# Patient Record
Sex: Female | Born: 1935 | Race: White | Hispanic: No | Smoking: Never smoker
Health system: Southern US, Community
[De-identification: ages and names within clinical notes are randomized; demographics above are authoritative.]

## PROBLEM LIST (undated history)

## (undated) DIAGNOSIS — G8929 Other chronic pain: Secondary | ICD-10-CM

## (undated) DIAGNOSIS — K219 Gastro-esophageal reflux disease without esophagitis: Secondary | ICD-10-CM

## (undated) DIAGNOSIS — G2581 Restless legs syndrome: Secondary | ICD-10-CM

## (undated) DIAGNOSIS — I10 Essential (primary) hypertension: Secondary | ICD-10-CM

## (undated) DIAGNOSIS — J302 Other seasonal allergic rhinitis: Secondary | ICD-10-CM

## (undated) DIAGNOSIS — M549 Dorsalgia, unspecified: Secondary | ICD-10-CM

## (undated) HISTORY — PX: CATARACT EXTRACTION: SUR2

## (undated) HISTORY — PX: HIP SURGERY: SHX245

## (undated) HISTORY — PX: BACK SURGERY: SHX140

---

## 2014-10-30 ENCOUNTER — Emergency Department (INDEPENDENT_AMBULATORY_CARE_PROVIDER_SITE_OTHER)
Admission: EM | Admit: 2014-10-30 | Discharge: 2014-10-30 | Disposition: A | Discharge status: Home / Self Care | Payer: Medicare Other | Source: Home / Self Care | Attending: Emergency Medicine | Admitting: Emergency Medicine

## 2014-10-30 ENCOUNTER — Emergency Department (INDEPENDENT_AMBULATORY_CARE_PROVIDER_SITE_OTHER): Payer: Medicare Other

## 2014-10-30 ENCOUNTER — Encounter: Payer: Self-pay | Admitting: *Deleted

## 2014-10-30 DIAGNOSIS — L539 Erythematous condition, unspecified: Secondary | ICD-10-CM

## 2014-10-30 DIAGNOSIS — M25421 Effusion, right elbow: Secondary | ICD-10-CM

## 2014-10-30 DIAGNOSIS — L03113 Cellulitis of right upper limb: Secondary | ICD-10-CM | POA: Diagnosis not present

## 2014-10-30 HISTORY — DX: Restless legs syndrome: G25.81

## 2014-10-30 HISTORY — DX: Dorsalgia, unspecified: M54.9

## 2014-10-30 HISTORY — DX: Gastro-esophageal reflux disease without esophagitis: K21.9

## 2014-10-30 HISTORY — DX: Other chronic pain: G89.29

## 2014-10-30 HISTORY — DX: Essential (primary) hypertension: I10

## 2014-10-30 HISTORY — DX: Other seasonal allergic rhinitis: J30.2

## 2014-10-30 MED ORDER — SULFAMETHOXAZOLE-TRIMETHOPRIM 800-160 MG PO TABS: 1.0000 | ORAL_TABLET | Freq: Two times a day (BID) | ORAL | Status: AC

## 2014-10-30 MED ORDER — CEFTRIAXONE SODIUM 1 G IJ SOLR
1.0000 g | Freq: Once | INTRAMUSCULAR | Status: AC
  Administered 2014-10-30: 1 g via INTRAMUSCULAR

## 2014-10-30 MED ORDER — CEPHALEXIN 500 MG PO CAPS: 500.0000 mg | ORAL_CAPSULE | Freq: Four times a day (QID) | ORAL | Status: DC

## 2014-10-30 NOTE — ED Notes (Signed)
Pt c/o RT elbow redness and swelling x 2 days. Denies fever. She reports a skin tear on that elbow x 1wk.

## 2014-10-30 NOTE — ED Provider Notes (Signed)
CSN: [id number]     Arrival date & time 10/30/14  1704 History   First MD Initiated Contact with Patient 10/30/14 1741     Chief Complaint  Patient presents with  . Joint Swelling   (Consider location/radiation/quality/duration/timing/severity/associated sxs/prior Treatment) Patient is a 80 y.o. female presenting with arm injury. The history is provided by the patient. No language interpreter was used.  Arm Injury Location:  Arm and elbow Time since incident:  2 days Injury: no   Arm location:  R arm Elbow location:  R elbow Pain details:    Quality:  Aching   Radiates to:  Does not radiate   Severity:  Moderate   Onset quality:  Gradual   Duration:  2 days   Timing:  Constant   Progression:  Worsening Chronicity:  New Handedness:  Right-handed Dislocation: no   Prior injury to area:  Yes Relieved by:  Acetaminophen Worsened by:  Nothing tried Ineffective treatments:  None tried Pt reports she hit her arm a week ago.  Now elbow is red and swollen.   [place]  Past Medical History  Diagnosis Date  . Hypertension   . Chronic back pain   . RLS (restless legs syndrome)   . GERD (gastroesophageal reflux disease)   . Seasonal allergies    Past Surgical History  Procedure Laterality Date  . Hip surgery    . Back surgery    . Cataract extraction     History reviewed. No pertinent family history. History  Substance Use Topics  . Smoking status: Never Smoker   . Smokeless tobacco: Not on file  . Alcohol Use: No   OB History    No data available     Review of Systems  Musculoskeletal: Positive for joint swelling.  Skin: Positive for color change.  All other systems reviewed and are negative.   Allergies  Motrin  Home Medications   Prior to Admission medications   Medication Sig Start Date End Date Taking? Authorizing Provider  aspirin 81 MG chewable tablet Chew by mouth daily.   Yes Historical Provider, MD  Biotin 1 MG CAPS Take by  mouth.   Yes Historical Provider, MD  Calcium-Phosphorus-Vitamin D (CITRACAL +D3 PO) Take by mouth.   Yes Historical Provider, MD  Carboxymethylcellul-Glycerin (REFRESH OPTIVE OP) Apply to eye.   Yes Historical Provider, MD  etodolac (LODINE) 400 MG tablet Take 400 mg by mouth 2 (two) times daily.   Yes Historical Provider, MD  ferrous fumarate (HEMOCYTE - 106 MG FE) 325 (106 FE) MG TABS tablet Take 1 tablet by mouth.   Yes Historical Provider, MD  fexofenadine (ALLEGRA) 180 MG tablet Take 180 mg by mouth daily.   Yes Historical Provider, MD  fluticasone (FLONASE) 50 MCG/ACT nasal spray Place into both nostrils daily.   Yes Historical Provider, MD  Fluticasone-Salmeterol (ADVAIR) 100-50 MCG/DOSE AEPB Inhale 1 puff into the lungs 2 (two) times daily.   Yes Historical Provider, MD  hydrochlorothiazide (HYDRODIURIL) 12.5 MG tablet Take 12.5 mg by mouth daily.   Yes Historical Provider, MD  levETIRAcetam (KEPPRA) 250 MG tablet Take 250 mg by mouth 2 (two) times daily.   Yes Historical Provider, MD  magnesium 30 MG tablet Take 500 mg by mouth 2 (two) times daily.   Yes Historical Provider, MD  montelukast (SINGULAIR) 10 MG tablet Take 10 mg by mouth at bedtime.   Yes Historical Provider, MD  oxybutynin (DITROPAN) 5 MG tablet Take 5 mg  by mouth 3 (three) times daily.   Yes Historical Provider, MD  pramipexole (MIRAPEX) 0.25 MG tablet Take 0.25 mg by mouth 3 (three) times daily.   Yes Historical Provider, MD  pregabalin (LYRICA) 150 MG capsule Take 150 mg by mouth 2 (two) times daily.   Yes Historical Provider, MD  primidone (MYSOLINE) 50 MG tablet Take by mouth 4 (four) times daily.   Yes Historical Provider, MD  propranolol (INDERAL) 60 MG tablet Take 60 mg by mouth 3 (three) times daily.   Yes Historical Provider, MD  RABEprazole (ACIPHEX) 20 MG tablet Take 20 mg by mouth daily.   Yes Historical Provider, MD  simvastatin (ZOCOR) 20 MG tablet Take 20 mg by mouth daily.   Yes Historical Provider, MD   vitamin E 100 UNIT capsule Take by mouth daily.   Yes Historical Provider, MD  Zinc 50 MG CAPS Take by mouth.   Yes Historical Provider, MD   BP 122/63 mmHg  Temp(Src) 98.4 F (36.9 C) (Oral)  Resp 16  Ht 4\' 8"  (1.422 m)  Wt 121 lb (54.885 kg)  BMI 27.14 kg/m2 Physical Exam  Constitutional: She appears well-developed.  HENT:  Head: Normocephalic.  Musculoskeletal: She exhibits tenderness.  Redness to mid upper arm, around right elbow,  Warm to touch   Neurological: She is alert.  Skin: There is erythema.  Psychiatric: She has a normal mood and affect.  Nursing note and vitals reviewed.   ED Course  Procedures (including critical care time) Labs Review Labs Reviewed - No data to display  Imaging Review No results found.   MDM   1. Cellulitis of right upper extremity   2. Redness    Pt given rocephin IM Rx for keflex and bactrim Pt advised to recheck here tomorrow. (pt does not want to go to hospital)     Elson AreasLeslie K Sofia, PA-C 10/30/14 1824

## 2014-10-30 NOTE — Discharge Instructions (Signed)

## 2014-10-31 ENCOUNTER — Emergency Department (INDEPENDENT_AMBULATORY_CARE_PROVIDER_SITE_OTHER)
Admission: EM | Admit: 2014-10-31 | Discharge: 2014-10-31 | Disposition: A | Discharge status: Home / Self Care | Payer: Medicare Other | Source: Home / Self Care | Attending: Family Medicine | Admitting: Family Medicine

## 2014-10-31 ENCOUNTER — Encounter: Payer: Self-pay | Admitting: *Deleted

## 2014-10-31 DIAGNOSIS — D649 Anemia, unspecified: Secondary | ICD-10-CM | POA: Diagnosis not present

## 2014-10-31 DIAGNOSIS — L03113 Cellulitis of right upper limb: Secondary | ICD-10-CM | POA: Diagnosis not present

## 2014-10-31 LAB — POCT CBC W AUTO DIFF (K'VILLE URGENT CARE)

## 2014-10-31 MED ORDER — CEFTRIAXONE SODIUM 1 G IJ SOLR
1.0000 g | Freq: Once | INTRAMUSCULAR | Status: AC
Start: 2014-10-31 — End: 2014-10-31
  Administered 2014-10-31: 1 g via INTRAMUSCULAR

## 2014-10-31 NOTE — ED Provider Notes (Signed)
CSN: [id number]     Arrival date & time 10/31/14  1612 History   First MD Initiated Contact with Patient 10/31/14 1621     Chief Complaint  Patient presents with  . Follow-up      HPI Comments: Patient returns for follow-up of cellulitis of her right arm.  She was evaluated here yesterday, and advised to proceed to local ER for evaluation but refused.  She was administered Rocephin 1gm IM, with oral Keflex and Bactrim, and advised to return today.   Today she reports that she has had a mild decrease in pain.  She denies fevers, chills, and sweats.  The history is provided by the patient and a relative.    Past Medical History  Diagnosis Date  . Hypertension   . Chronic back pain   . RLS (restless legs syndrome)   . GERD (gastroesophageal reflux disease)   . Seasonal allergies    Past Surgical History  Procedure Laterality Date  . Hip surgery    . Back surgery    . Cataract extraction     History reviewed. No pertinent family history. History  Substance Use Topics  . Smoking status: Never Smoker   . Smokeless tobacco: Not on file  . Alcohol Use: No   OB History    No data available     Review of Systems  Constitutional: Negative for fever, chills, activity change and fatigue.  HENT: Negative.   Eyes: Negative.   Respiratory: Negative.   Cardiovascular: Negative.   Gastrointestinal: Negative.   Genitourinary: Negative.   Musculoskeletal: Positive for joint swelling.  Skin: Positive for color change.  Neurological: Negative for dizziness.    Allergies  Motrin  Home Medications   Prior to Admission medications   Medication Sig Start Date End Date Taking? Authorizing Provider  aspirin 81 MG chewable tablet Chew by mouth daily.    Historical Provider, MD  Biotin 1 MG CAPS Take by mouth.    Historical Provider, MD  Calcium-Phosphorus-Vitamin D (CITRACAL +D3 PO) Take by mouth.    Historical Provider, MD  Carboxymethylcellul-Glycerin (REFRESH OPTIVE OP) Apply to  eye.    Historical Provider, MD  cephALEXin (KEFLEX) 500 MG capsule Take 1 capsule (500 mg total) by mouth 4 (four) times daily. 10/30/14   Elson AreasLeslie K Sofia, PA-C  etodolac (LODINE) 400 MG tablet Take 400 mg by mouth 2 (two) times daily.    Historical Provider, MD  ferrous fumarate (HEMOCYTE - 106 MG FE) 325 (106 FE) MG TABS tablet Take 1 tablet by mouth.    Historical Provider, MD  fexofenadine (ALLEGRA) 180 MG tablet Take 180 mg by mouth daily.    Historical Provider, MD  fluticasone (FLONASE) 50 MCG/ACT nasal spray Place into both nostrils daily.    Historical Provider, MD  Fluticasone-Salmeterol (ADVAIR) 100-50 MCG/DOSE AEPB Inhale 1 puff into the lungs 2 (two) times daily.    Historical Provider, MD  hydrochlorothiazide (HYDRODIURIL) 12.5 MG tablet Take 12.5 mg by mouth daily.    Historical Provider, MD  levETIRAcetam (KEPPRA) 250 MG tablet Take 250 mg by mouth 2 (two) times daily.    Historical Provider, MD  magnesium 30 MG tablet Take 500 mg by mouth 2 (two) times daily.    Historical Provider, MD  montelukast (SINGULAIR) 10 MG tablet Take 10 mg by mouth at bedtime.    Historical Provider, MD  oxybutynin (DITROPAN) 5 MG tablet Take 5 mg by mouth 3 (three) times daily.    Historical Provider, MD  pramipexole (MIRAPEX) 0.25 MG tablet Take 0.25 mg by mouth 3 (three) times daily.    Historical Provider, MD  pregabalin (LYRICA) 150 MG capsule Take 150 mg by mouth 2 (two) times daily.    Historical Provider, MD  primidone (MYSOLINE) 50 MG tablet Take by mouth 4 (four) times daily.    Historical Provider, MD  propranolol (INDERAL) 60 MG tablet Take 60 mg by mouth 3 (three) times daily.    Historical Provider, MD  RABEprazole (ACIPHEX) 20 MG tablet Take 20 mg by mouth daily.    Historical Provider, MD  simvastatin (ZOCOR) 20 MG tablet Take 20 mg by mouth daily.    Historical Provider, MD  sulfamethoxazole-trimethoprim (BACTRIM DS,SEPTRA DS) 800-160 MG per tablet Take 1 tablet by mouth 2 (two) times  daily. 10/30/14 11/06/14  Elson AreasLeslie K Sofia, PA-C  vitamin E 100 UNIT capsule Take by mouth daily.    Historical Provider, MD  Zinc 50 MG CAPS Take by mouth.    Historical Provider, MD   BP 92/55 mmHg  Temp(Src) 97.7 F (36.5 C) (Oral)  Resp 14 Physical Exam  Constitutional: She is oriented to person, place, and time. No distress.  HENT:  Head: Normocephalic.  Eyes: Pupils are equal, round, and reactive to light.  Neck: Neck supple.  Musculoskeletal:       Right elbow: She exhibits swelling and effusion. She exhibits normal range of motion, no deformity and no laceration. Tenderness found.       Arms: Right elbow is diffusely erythematous and warm.  Erythema extends proximally to the right upper arm. The olecranon bursa is fluid filled but not tense.  There is bony tenderness to palpation over the elbow epicondyles.  Erythema and mild edema extends to the hand, but no tenderness over hand and distal forearm.  Distal neurovascular function is intact.   Neurological: She is alert and oriented to person, place, and time.  Skin: Skin is warm and dry.  Nursing note and vitals reviewed.   ED Course  Procedures  none    Labs Reviewed  POCT CBC W AUTO DIFF ([facility]) - Abnormal; Notable for the following:  WBC 27.7; LY 9.1; MO 1.6; GR 89.3; Hgb 9.6; Platelets 322     Imaging Review Dg Elbow Complete Right  10/30/2014   CLINICAL DATA:  Right elbow pain for 3 days. Redness and minimal swelling.  EXAM: RIGHT ELBOW - COMPLETE 3+ VIEW  COMPARISON:  None  FINDINGS: There is mild soft tissue swelling surrounding the elbow. Elevation of the anterior fat pad may indicate a small joint effusion. The posterior fat pad appears normal. No displaced fractures noted.  IMPRESSION: 1. Soft tissue swelling. 2. Suspect small effusion. 3. No fractures identified.   Electronically Signed   By: Signa Kellaylor  Stroud M.D.   On: 10/30/2014 18:33     MDM   1. Cellulitis of arm, right; suspect septic arthritis.  Note leukocytosis 27.7 Anemia   Patient continues to refuse hospital admission.  Son agrees that he will take his mother back to [place] within the next 18 hours for hospital admission at home. Will repeat Rocephin 1gm IM Continue present antibiotics.  Elevate arm.  Apply heating pad several times daily.  If symptoms become significantly worse during the night or over the weekend, proceed to the local emergency room.     Lattie HawStephen A Jaiyanna Safran, MD 10/31/14 2003

## 2014-10-31 NOTE — ED Notes (Signed)
Jamie SquibbJane is here today for a recheck of her RT arm cellulitis.

## 2014-10-31 NOTE — Discharge Instructions (Signed)
Continue present antibiotics.  Elevate arm.  Apply heating pad several times daily.  If symptoms become significantly worse during the night or over the weekend, proceed to the local emergency room.

## 2015-04-27 ENCOUNTER — Encounter: Payer: Self-pay | Admitting: Emergency Medicine

## 2015-04-27 ENCOUNTER — Emergency Department (INDEPENDENT_AMBULATORY_CARE_PROVIDER_SITE_OTHER)
Admission: EM | Admit: 2015-04-27 | Discharge: 2015-04-27 | Disposition: A | Discharge status: Home / Self Care | Payer: Medicare Other | Source: Home / Self Care | Attending: Family Medicine | Admitting: Family Medicine

## 2015-04-27 DIAGNOSIS — S0096XA Insect bite (nonvenomous) of unspecified part of head, initial encounter: Secondary | ICD-10-CM

## 2015-04-27 DIAGNOSIS — W57XXXA Bitten or stung by nonvenomous insect and other nonvenomous arthropods, initial encounter: Secondary | ICD-10-CM

## 2015-04-27 MED ORDER — DOXYCYCLINE HYCLATE 100 MG PO CAPS: ORAL_CAPSULE | ORAL | Status: DC

## 2015-04-27 NOTE — ED Notes (Signed)
Pt states she removed a large tick from the back of her head today. It is red and swollen.

## 2015-04-27 NOTE — ED Provider Notes (Signed)
CSN: [id number]     Arrival date & time 04/27/15  1708 History   First MD Initiated Contact with Patient 04/27/15 1747     Chief Complaint  Patient presents with  . Insect Bite      HPI Comments: Patient discovered a large embedded tick on her scalp today.  She is not sure how long the tick had been present but believes that it may have been about 12 hours.  She feels well otherwise.  No rash.  No myalgias or arthralgias.  No fevers, chills, and sweats.  The history is provided by the patient and the spouse.    Past Medical History  Diagnosis Date  . Hypertension   . Chronic back pain   . RLS (restless legs syndrome)   . GERD (gastroesophageal reflux disease)   . Seasonal allergies    Past Surgical History  Procedure Laterality Date  . Hip surgery    . Back surgery    . Cataract extraction     History reviewed. No pertinent family history. History  Substance Use Topics  . Smoking status: Never Smoker   . Smokeless tobacco: Not on file  . Alcohol Use: No   OB History    No data available     Review of Systems  Constitutional: Negative for fever, chills, diaphoresis, activity change and fatigue.  HENT:       Tick bite on posterior scalp  Eyes: Negative.   Respiratory: Negative.   Cardiovascular: Negative.   Gastrointestinal: Negative.   Genitourinary: Negative.   Musculoskeletal: Negative for myalgias, joint swelling, arthralgias and neck stiffness.  Skin: Positive for wound.  Neurological: Negative for headaches.  Hematological: Negative for adenopathy.    Allergies  Motrin  Home Medications   Prior to Admission medications   Medication Sig Start Date End Date Taking? Authorizing Provider  aspirin 81 MG chewable tablet Chew by mouth daily.    Historical Provider, MD  Biotin 1 MG CAPS Take by mouth.    Historical Provider, MD  Calcium-Phosphorus-Vitamin D (CITRACAL +D3 PO) Take by mouth.    Historical Provider, MD  Carboxymethylcellul-Glycerin (REFRESH  OPTIVE OP) Apply to eye.    Historical Provider, MD  doxycycline (VIBRAMYCIN) 100 MG capsule Take two tabs by mouth as a single dose. Take with food. 04/27/15   Lattie Haw, MD  ferrous fumarate (HEMOCYTE - 106 MG FE) 325 (106 FE) MG TABS tablet Take 1 tablet by mouth.    Historical Provider, MD  fexofenadine (ALLEGRA) 180 MG tablet Take 180 mg by mouth daily.    Historical Provider, MD  fluticasone (FLONASE) 50 MCG/ACT nasal spray Place into both nostrils daily.    Historical Provider, MD  Fluticasone-Salmeterol (ADVAIR) 100-50 MCG/DOSE AEPB Inhale 1 puff into the lungs 2 (two) times daily.    Historical Provider, MD  hydrochlorothiazide (HYDRODIURIL) 12.5 MG tablet Take 12.5 mg by mouth daily.    Historical Provider, MD  levETIRAcetam (KEPPRA) 250 MG tablet Take 250 mg by mouth 2 (two) times daily.    Historical Provider, MD  magnesium 30 MG tablet Take 500 mg by mouth 2 (two) times daily.    Historical Provider, MD  montelukast (SINGULAIR) 10 MG tablet Take 10 mg by mouth at bedtime.    Historical Provider, MD  oxybutynin (DITROPAN) 5 MG tablet Take 5 mg by mouth 3 (three) times daily.    Historical Provider, MD  pramipexole (MIRAPEX) 0.25 MG tablet Take 0.25 mg by mouth 3 (three) times daily.  Historical Provider, MD  pregabalin (LYRICA) 150 MG capsule Take 150 mg by mouth 2 (two) times daily.    Historical Provider, MD  primidone (MYSOLINE) 50 MG tablet Take by mouth 4 (four) times daily.    Historical Provider, MD  propranolol (INDERAL) 60 MG tablet Take 60 mg by mouth 3 (three) times daily.    Historical Provider, MD  RABEprazole (ACIPHEX) 20 MG tablet Take 20 mg by mouth daily.    Historical Provider, MD  simvastatin (ZOCOR) 20 MG tablet Take 20 mg by mouth daily.    Historical Provider, MD  vitamin E 100 UNIT capsule Take by mouth daily.    Historical Provider, MD  Zinc 50 MG CAPS Take by mouth.    Historical Provider, MD   BP 179/83 mmHg  Pulse 68  Temp(Src) 97.8 F (36.6 C)  (Oral)  SpO2 96% Physical Exam  Constitutional: She is oriented to person, place, and time. She appears well-developed and well-nourished. No distress.  HENT:  Head:    Right Ear: External ear normal.  Left Ear: External ear normal.  Nose: Nose normal.  Mouth/Throat: Oropharynx is clear and moist.  Posterior scalp reveals a 1cm diameter patch of erythema (not a "bulls eye" rash) as noted on diagram.  No swelling or tenderness to palpation.    Eyes: Conjunctivae are normal. Pupils are equal, round, and reactive to light.  Neck: Neck supple.  Cardiovascular: Normal heart sounds.   Pulmonary/Chest: Breath sounds normal.  Abdominal: There is no tenderness.  Musculoskeletal: She exhibits no edema.  Lymphadenopathy:    She has no cervical adenopathy.  Neurological: She is alert and oriented to person, place, and time.  Skin: Skin is warm and dry. No rash noted.  Nursing note and vitals reviewed.   ED Course  Procedures none  MDM   1. Tick bite of head, initial encounter    Begin prophylactic dose of doxycycline:   as a single dose. Followup with Family Doctor if symptoms worsen.    Lattie Haw, MD 05/01/15 [phone]

## 2015-04-27 NOTE — Discharge Instructions (Signed)
Tick Bite Information Ticks are insects that attach themselves to the skin and draw blood for food. There are various types of ticks. Common types include wood ticks and deer ticks. Most ticks live in shrubs and grassy areas. Ticks can climb onto your body when you make contact with leaves or grass where the tick is waiting. The most common places on the body for ticks to attach themselves are the scalp, neck, armpits, waist, and groin. Most tick bites are harmless, but sometimes ticks carry germs that cause diseases. These germs can be spread to a person during the tick's feeding process. The chance of a disease spreading through a tick bite depends on:   The type of tick.  Time of year.   How long the tick is attached.   Geographic location.  HOW CAN YOU PREVENT TICK BITES? Take these steps to help prevent tick bites when you are outdoors:  Wear protective clothing. Long sleeves and long pants are best.   Wear white clothes so you can see ticks more easily.  Tuck your pant legs into your socks.   If walking on a trail, stay in the middle of the trail to avoid brushing against bushes.  Avoid walking through areas with long grass.  Put insect repellent on all exposed skin and along boot tops, pant legs, and sleeve cuffs.   Check clothing, hair, and skin repeatedly and before going inside.   Brush off any ticks that are not attached.  Take a shower or bath as soon as possible after being outdoors.  WHAT IS THE PROPER WAY TO REMOVE A TICK? Ticks should be removed as soon as possible to help prevent diseases caused by tick bites. 1. If latex gloves are available, put them on before trying to remove a tick.  2. Using fine-point tweezers, grasp the tick as close to the skin as possible. You may also use curved forceps or a tick removal tool. Grasp the tick as close to its head as possible. Avoid grasping the tick on its body. 3. Pull gently with steady upward pressure until  the tick lets go. Do not twist the tick or jerk it suddenly. This may break off the tick's head or mouth parts. 4. Do not squeeze or crush the tick's body. This could force disease-carrying fluids from the tick into your body.  5. After the tick is removed, wash the bite area and your hands with soap and water or other disinfectant such as alcohol. 6. Apply a small amount of antiseptic cream or ointment to the bite site.  7. Wash and disinfect any instruments that were used.  Do not try to remove a tick by applying a hot match, petroleum jelly, or fingernail polish to the tick. These methods do not work and may increase the chances of disease being spread from the tick bite.  WHEN SHOULD YOU SEEK MEDICAL CARE? Contact your health care provider if you are unable to remove a tick from your skin or if a part of the tick breaks off and is stuck in the skin.  After a tick bite, you need to be aware of signs and symptoms that could be related to diseases spread by ticks. Contact your health care provider if you develop any of the following in the days or weeks after the tick bite:  Unexplained fever.  Rash. A circular rash that appears days or weeks after the tick bite may indicate the possibility of Lyme disease. The rash may resemble   a target with a bull's-eye and may occur at a different part of your body than the tick bite.  Redness and swelling in the area of the tick bite.   Tender, swollen lymph glands.   Diarrhea.   Weight loss.   Cough.   Fatigue.   Muscle, joint, or bone pain.   Abdominal pain.   Headache.   Lethargy or a change in your level of consciousness.  Difficulty walking or moving your legs.   Numbness in the legs.   Paralysis.  Shortness of breath.   Confusion.   Repeated vomiting.  Document Released: 10/16/2000 Document Revised: 08/09/2013 Document Reviewed: 03/29/2013 [employer] Patient Information 2015 [employer]. This information is  not intended to replace advice given to you by your health care provider. Make sure you discuss any questions you have with your health care provider.  

## 2015-05-02 ENCOUNTER — Telehealth: Payer: Self-pay | Admitting: Emergency Medicine

## 2015-10-25 ENCOUNTER — Encounter: Payer: Self-pay | Admitting: Emergency Medicine

## 2015-10-25 ENCOUNTER — Emergency Department (INDEPENDENT_AMBULATORY_CARE_PROVIDER_SITE_OTHER)
Admission: EM | Admit: 2015-10-25 | Discharge: 2015-10-25 | Disposition: A | Discharge status: Home / Self Care | Payer: Medicare Other | Source: Home / Self Care | Attending: Family Medicine | Admitting: Family Medicine

## 2015-10-25 DIAGNOSIS — S61213A Laceration without foreign body of left middle finger without damage to nail, initial encounter: Secondary | ICD-10-CM | POA: Diagnosis not present

## 2015-10-25 DIAGNOSIS — S61211A Laceration without foreign body of left index finger without damage to nail, initial encounter: Secondary | ICD-10-CM

## 2015-10-25 NOTE — Discharge Instructions (Signed)
Change dressing daily and apply Bacitracin ointment to wound.  Keep wound clean and dry.  Return for any signs of infection (or follow-up with family doctor):  Increasing redness, swelling, pain, heat, drainage, etc. Return in 10 days for suture removal.  Follow instructions in Dermabond information sheet.   Laceration Care, Adult A laceration is a cut that goes through all of the layers of the skin and into the tissue that is right under the skin. Some lacerations heal on their own. Others need to be closed with stitches (sutures), staples, skin adhesive strips, or skin glue. Proper laceration care minimizes the risk of infection and helps the laceration to heal better. HOW TO CARE FOR YOUR LACERATION If sutures or staples were used:  Keep the wound clean and dry.  If you were given a bandage (dressing), you should change it at least one time per day or as told by your health care provider. You should also change it if it becomes wet or dirty.  Keep the wound completely dry for the first 24 hours or as told by your health care provider. After that time, you may shower or bathe. However, make sure that the wound is not soaked in water until after the sutures or staples have been removed.  Clean the wound one time each day or as told by your health care provider:  Wash the wound with soap and water.  Rinse the wound with water to remove all soap.  Pat the wound dry with a clean towel. Do not rub the wound.  After cleaning the wound, apply a thin layer of antibiotic ointmentas told by your health care provider. This will help to prevent infection and keep the dressing from sticking to the wound.  Have the sutures or staples removed as told by your health care provider. If skin adhesive strips were used:  Keep the wound clean and dry.  If you were given a bandage (dressing), you should change it at least one time per day or as told by your health care provider. You should also change it  if it becomes dirty or wet.  Do not get the skin adhesive strips wet. You may shower or bathe, but be careful to keep the wound dry.  If the wound gets wet, pat it dry with a clean towel. Do not rub the wound.  Skin adhesive strips fall off on their own. You may trim the strips as the wound heals. Do not remove skin adhesive strips that are still stuck to the wound. They will fall off in time. If skin glue was used:  Try to keep the wound dry, but you may briefly wet it in the shower or bath. Do not soak the wound in water, such as by swimming.  After you have showered or bathed, gently pat the wound dry with a clean towel. Do not rub the wound.  Do not do any activities that will make you sweat heavily until the skin glue has fallen off on its own.  Do not apply liquid, cream, or ointment medicine to the wound while the skin glue is in place. Using those may loosen the film before the wound has healed.  If you were given a bandage (dressing), you should change it at least one time per day or as told by your health care provider. You should also change it if it becomes dirty or wet.  If a dressing is placed over the wound, be careful not to apply tape  directly over the skin glue. Doing that may cause the glue to be pulled off before the wound has healed.  Do not pick at the glue. The skin glue usually remains in place for 5-10 days, then it falls off of the skin. General Instructions  Take over-the-counter and prescription medicines only as told by your health care provider.  If you were prescribed an antibiotic medicine or ointment, take or apply it as told by your doctor. Do not stop using it even if your condition improves.  To help prevent scarring, make sure to cover your wound with sunscreen whenever you are outside after stitches are removed, after adhesive strips are removed, or when glue remains in place and the wound is healed. Make sure to wear a sunscreen of at least 30  SPF.  Do not scratch or pick at the wound.  Keep all follow-up visits as told by your health care provider. This is important.  Check your wound every day for signs of infection. Watch for:  Redness, swelling, or pain.  Fluid, blood, or pus.  Raise (elevate) the injured area above the level of your heart while you are sitting or lying down, if possible. SEEK MEDICAL CARE IF:  You received a tetanus shot and you have swelling, severe pain, redness, or bleeding at the injection site.  You have a fever.  A wound that was closed breaks open.  You notice a bad smell coming from your wound or your dressing.  You notice something coming out of the wound, such as wood or glass.  Your pain is not controlled with medicine.  You have increased redness, swelling, or pain at the site of your wound.  You have fluid, blood, or pus coming from your wound.  You notice a change in the color of your skin near your wound.  You need to change the dressing frequently due to fluid, blood, or pus draining from the wound.  You develop a new rash.  You develop numbness around the wound. SEEK IMMEDIATE MEDICAL CARE IF:  You develop severe swelling around the wound.  Your pain suddenly increases and is severe.  You develop painful lumps near the wound or on skin that is anywhere on your body.  You have a red streak going away from your wound.  The wound is on your hand or foot and you cannot properly move a finger or toe.  The wound is on your hand or foot and you notice that your fingers or toes look pale or bluish.   This information is not intended to replace advice given to you by your health care provider. Make sure you discuss any questions you have with your health care provider.   Document Released: 10/19/2005 Document Revised: 03/05/2015 Document Reviewed: 10/15/2014 Elsevier Interactive Patient Education [employer]2016 [employer] Inc.

## 2015-10-25 NOTE — ED Provider Notes (Signed)
CSN: [id number]     Arrival date & time 10/25/15  1203 History   First MD Initiated Contact with Patient 10/25/15 1325     Chief Complaint  Patient presents with  . Extremity Laceration      HPI Comments: Patient injured her left second and third fingers on the hinge side of door today, resulting in two small lacerations.  Her Tdap is current  Patient is a 81 y.o. female presenting with skin laceration. The history is provided by the patient and a relative.  Laceration Location:  Finger Finger laceration location:  L index finger and L middle finger Length (cm):  1 Depth:  Through dermis Quality: straight   Bleeding: controlled   Time since incident:  30 minutes Laceration mechanism:  Metal edge Pain details:    Quality:  Aching   Severity:  Mild   Timing:  Constant   Progression:  Unchanged Foreign body present:  No foreign bodies Worsened by:  Movement Ineffective treatments:  Pressure Tetanus status:  Up to date   Past Medical History  Diagnosis Date  . Hypertension   . Chronic back pain   . RLS (restless legs syndrome)   . GERD (gastroesophageal reflux disease)   . Seasonal allergies    Past Surgical History  Procedure Laterality Date  . Hip surgery    . Back surgery    . Cataract extraction     History reviewed. No pertinent family history. Social History  Substance Use Topics  . Smoking status: Never Smoker   . Smokeless tobacco: None  . Alcohol Use: No   OB History    No data available     Review of Systems  All other systems reviewed and are negative.   Allergies  Motrin  Home Medications   Prior to Admission medications   Medication Sig Start Date End Date Taking? Authorizing Provider  etodolac (LODINE) 400 MG tablet Take 400 mg by mouth 2 (two) times daily.   Yes Historical Provider, MD  aspirin 81 MG chewable tablet Chew by mouth daily.    Historical Provider, MD  Biotin 1 MG CAPS Take by mouth.    Historical Provider, MD    Calcium-Phosphorus-Vitamin D (CITRACAL +D3 PO) Take by mouth.    Historical Provider, MD  Carboxymethylcellul-Glycerin (REFRESH OPTIVE OP) Apply to eye.    Historical Provider, MD  doxycycline (VIBRAMYCIN) 100 MG capsule Take two tabs by mouth as a single dose. Take with food. 04/27/15   Lattie HawStephen A Beese, MD  ferrous fumarate (HEMOCYTE - 106 MG FE) 325 (106 FE) MG TABS tablet Take 1 tablet by mouth.    Historical Provider, MD  fexofenadine (ALLEGRA) 180 MG tablet Take 180 mg by mouth daily.    Historical Provider, MD  fluticasone (FLONASE) 50 MCG/ACT nasal spray Place into both nostrils daily.    Historical Provider, MD  Fluticasone-Salmeterol (ADVAIR) 100-50 MCG/DOSE AEPB Inhale 1 puff into the lungs 2 (two) times daily.    Historical Provider, MD  hydrochlorothiazide (HYDRODIURIL) 12.5 MG tablet Take 12.5 mg by mouth daily.    Historical Provider, MD  levETIRAcetam (KEPPRA) 250 MG tablet Take 250 mg by mouth 2 (two) times daily.    Historical Provider, MD  magnesium 30 MG tablet Take 500 mg by mouth 2 (two) times daily.    Historical Provider, MD  montelukast (SINGULAIR) 10 MG tablet Take 10 mg by mouth at bedtime.    Historical Provider, MD  oxybutynin (DITROPAN) 5 MG tablet Take 5 mg  by mouth 3 (three) times daily.    Historical Provider, MD  pramipexole (MIRAPEX) 0.25 MG tablet Take 0.25 mg by mouth 3 (three) times daily.    Historical Provider, MD  pregabalin (LYRICA) 150 MG capsule Take 150 mg by mouth 2 (two) times daily.    Historical Provider, MD  primidone (MYSOLINE) 50 MG tablet Take by mouth 4 (four) times daily.    Historical Provider, MD  propranolol (INDERAL) 60 MG tablet Take 60 mg by mouth 3 (three) times daily.    Historical Provider, MD  RABEprazole (ACIPHEX) 20 MG tablet Take 20 mg by mouth daily.    Historical Provider, MD  simvastatin (ZOCOR) 20 MG tablet Take 20 mg by mouth daily.    Historical Provider, MD  vitamin E 100 UNIT capsule Take by mouth daily.    Historical  Provider, MD  Zinc 50 MG CAPS Take by mouth.    Historical Provider, MD   Meds Ordered and Administered this Visit  Medications - No data to display  BP 160/69 mmHg  Pulse 68  Temp(Src) 98.3 F (36.8 C) (Oral)  Resp 18  Ht  (1.422 m)  Wt 120 lb (54.432 kg)  BMI 26.92 kg/m2  SpO2 96% No data found.   Physical Exam  Constitutional: She is oriented to person, place, and time. She appears well-developed and well-nourished. No distress.  HENT:  Head: Atraumatic.  Eyes: Pupils are equal, round, and reactive to light.  Pulmonary/Chest: No respiratory distress.  Musculoskeletal:       Left hand: She exhibits tenderness and laceration. She exhibits normal range of motion, no bony tenderness, normal two-point discrimination, normal capillary refill, no deformity and no swelling. Normal sensation noted.       Hands: On the dorsum of the left second finger proximal phalanx is an 8mm simple superficial linear laceration as noted on diagram.  On the dorsum of the left third finger proximal phalanx is a 2mm simple superficial laceration.  Distal neurovascular function is intact.  Fingers have full range of motion.    Neurological: She is alert and oriented to person, place, and time.  Skin: Skin is warm and dry.  Nursing note and vitals reviewed.   ED Course  Procedures Laceration Repair Discussed benefits and risks of procedure and verbal consent obtained. Using sterile technique and digital 2% lidocaine without epinephrine to the left second finger, cleansed wounds with Betadine followed by copious lavage with normal saline.  Wounds carefully inspected for debris and foreign bodies; none found.  Wound on second finger closed with #3, 5-0 interrupted nylon sutures.  Small minimal laceration on dorsum of left third finger closed with Dermabond.  Bacitracin and non-stick sterile dressing applied to wound on second finger.  Wound precautions explained to patient.  Return for suture removal  in 10 days.    MDM   1. Laceration of second finger, left, initial encounter   2. Laceration of third finger, left, initial encounter     Change dressing daily and apply Bacitracin ointment to wound.  Keep wound clean and dry.  Return for any signs of infection (or follow-up with family doctor):  Increasing redness, swelling, pain, heat, drainage, etc. Return in 10 days for suture removal.  Follow instructions in Dermabond information sheet.    Lattie Haw, MD 10/30/15 [phone]

## 2015-10-25 NOTE — ED Notes (Addendum)
Earlier today patient caught left thumb and index fingers on hinges of a door; will not stop bleeding, although current bandaides are not saturated. Has had tetanus immunization in past 2-3 years. Smiling and alert.

## 2015-10-30 ENCOUNTER — Encounter: Payer: Self-pay | Admitting: *Deleted

## 2015-10-30 ENCOUNTER — Emergency Department (INDEPENDENT_AMBULATORY_CARE_PROVIDER_SITE_OTHER)
Admission: EM | Admit: 2015-10-30 | Discharge: 2015-10-30 | Disposition: A | Discharge status: Home / Self Care | Payer: Medicare Other | Source: Home / Self Care | Attending: Emergency Medicine | Admitting: Emergency Medicine

## 2015-10-30 ENCOUNTER — Emergency Department (INDEPENDENT_AMBULATORY_CARE_PROVIDER_SITE_OTHER): Payer: Medicare Other

## 2015-10-30 DIAGNOSIS — M40209 Unspecified kyphosis, site unspecified: Secondary | ICD-10-CM

## 2015-10-30 DIAGNOSIS — J209 Acute bronchitis, unspecified: Secondary | ICD-10-CM

## 2015-10-30 DIAGNOSIS — R05 Cough: Secondary | ICD-10-CM | POA: Diagnosis not present

## 2015-10-30 DIAGNOSIS — J4541 Moderate persistent asthma with (acute) exacerbation: Secondary | ICD-10-CM | POA: Diagnosis not present

## 2015-10-30 MED ORDER — METHYLPREDNISOLONE ACETATE 80 MG/ML IJ SUSP
80.0000 mg | Freq: Once | INTRAMUSCULAR | Status: AC
  Administered 2015-10-30: 80 mg via INTRAMUSCULAR

## 2015-10-30 MED ORDER — IPRATROPIUM-ALBUTEROL 0.5-2.5 (3) MG/3ML IN SOLN
3.0000 mL | RESPIRATORY_TRACT | Status: AC
Start: 2015-10-30 — End: 2015-10-30
  Administered 2015-10-30: 3 mL via RESPIRATORY_TRACT

## 2015-10-30 MED ORDER — CEFTRIAXONE SODIUM 250 MG IJ SOLR
500.0000 mg | Freq: Once | INTRAMUSCULAR | Status: AC
  Administered 2015-10-30: 500 mg via INTRAMUSCULAR

## 2015-10-30 MED ORDER — CEFDINIR 300 MG PO CAPS: 300.0000 mg | ORAL_CAPSULE | Freq: Two times a day (BID) | ORAL | Status: DC

## 2015-10-30 MED ORDER — ALBUTEROL SULFATE HFA 108 (90 BASE) MCG/ACT IN AERS
2.0000 | INHALATION_SPRAY | RESPIRATORY_TRACT | Status: DC | PRN
Start: 2015-10-30 — End: 2016-03-19

## 2015-10-30 MED ORDER — PREDNISONE 20 MG PO TABS: 20.0000 mg | ORAL_TABLET | Freq: Two times a day (BID) | ORAL | Status: DC

## 2015-10-30 NOTE — Discharge Instructions (Signed)
Asthma, Adult Asthma is a condition of the lungs in which the airways tighten and narrow. Asthma can make it hard to breathe. Asthma cannot be cured, but medicine and lifestyle changes can help control it. Asthma may be started (triggered) by:  Animal skin flakes (dander).  Dust.  Cockroaches.  Pollen.  Mold.  Smoke.  Cleaning products.  Hair sprays or aerosol sprays.  Paint fumes or strong smells.  Cold air, weather changes, and winds.  Crying or laughing hard.  Stress.  Certain medicines or drugs.  Foods, such as dried fruit, potato chips, and sparkling grape juice.  Infections or conditions (colds, flu).  Exercise.  Certain medical conditions or diseases.  Exercise or tiring activities.  Here in urgent care, we gave you 2 shots. One shot was an antibiotic and 1 was a corticosteroid to" jumpstart" your treatment. HOME CARE   Take medicine as told by your doctor.--See list of new prescriptions given today.  Continue Advair twice a day.   Albuterol/pro-air HFA rescue inhaler, 2 puffs every 4 hours if needed for wheezing  GO TO EMERGENCY ROOM IF ANY SEVERE OR WORSENING SYMPTOMS   Acute Bronchitis Bronchitis is when the airways that extend from the windpipe into the lungs get red, puffy, and painful (inflamed). Bronchitis often causes thick spit (mucus) to develop. This leads to a cough. A cough is the most common symptom of bronchitis. In acute bronchitis, the condition usually begins suddenly and goes away over time (usually in 2 weeks). Smoking, allergies, and asthma can make bronchitis worse. Repeated episodes of bronchitis may cause more lung problems. HOME CARE Rest. Drink enough fluids to keep your pee (urine) clear or pale yellow (unless you need to limit fluids as told by your doctor). Only take over-the-counter or prescription medicines as told by your doctor. Avoid smoking and secondhand smoke. These can make bronchitis worse. If you are a smoker,  think about using nicotine gum or skin patches. Quitting smoking will help your lungs heal faster. Reduce the chance of getting bronchitis again by: Washing your hands often. Avoiding people with cold symptoms. Trying not to touch your hands to your mouth, nose, or eyes. Follow up with your doctor as told. GET HELP IF: Your symptoms do not improve after 1 week of treatment. Symptoms include: Cough. Fever. Coughing up thick spit. Body aches. Chest congestion. Chills. Shortness of breath. Sore throat. GET HELP RIGHT AWAY IF:  You have an increased fever. You have chills. You have severe shortness of breath. You have bloody thick spit (sputum). You throw up (vomit) often. You lose too much body fluid (dehydration). You have a severe headache. You faint. MAKE SURE YOU:  Understand these instructions. Will watch your condition. Will get help right away if you are not doing well or get worse.   This information is not intended to replace advice given to you by your health care provider. Make sure you discuss any questions you have with your health care provider.   Document Released: 04/06/2008 Document Revised: 06/21/2013 Document Reviewed: 04/11/2013 Elsevier Interactive Patient Education 2016 [employer].   Use a peak flow meter as told by your doctor. A peak flow meter is a tool that measures how well the lungs are working.  Record and keep track of the peak flow meter's readings.  Understand and use the asthma action plan. An asthma action plan is a written plan for taking care of your asthma and treating your attacks.  To help prevent asthma attacks:  Do not smoke. Stay away from secondhand smoke.  Change your heating and air conditioning filter often.  Limit your use of fireplaces and wood stoves.  Get rid of pests (such as roaches and mice) and their droppings.  Throw away plants if you see mold on them.  Clean your floors. Dust regularly. Use cleaning  products that do not smell.  Have someone vacuum when you are not home. Use a vacuum cleaner with a HEPA filter if possible.  Replace carpet with wood, tile, or vinyl flooring. Carpet can trap animal skin flakes and dust.  Use allergy-proof pillows, mattress covers, and box spring covers.  Wash bed sheets and blankets every week in hot water and dry them in a dryer.  Use blankets that are made of polyester or cotton.  Clean bathrooms and kitchens with bleach. If possible, have someone repaint the walls in these rooms with mold-resistant paint. Keep out of the rooms that are being cleaned and painted.  Wash hands often. GET HELP IF:  You have make a whistling sound when breaking (wheeze), have shortness of breath, or have a cough even if taking medicine to prevent attacks.  The colored mucus you cough up (sputum) is thicker than usual.  The colored mucus you cough up changes from clear or white to yellow, green, gray, or bloody.  You have problems from the medicine you are taking such as:  A rash.  Itching.  Swelling.  Trouble breathing.  You need reliever medicines more than 2-3 times a week.  Your peak flow measurement is still at 50-79% of your personal best after following the action plan for 1 hour.  You have a fever. GET HELP RIGHT AWAY IF:   You seem to be worse and are not responding to medicine during an asthma attack.  You are short of breath even at rest.  You get short of breath when doing very little activity.  You have trouble eating, drinking, or talking.  You have chest pain.  You have a fast heartbeat.  Your lips or fingernails start to turn blue.  You are light-headed, dizzy, or faint.  Your peak flow is less than 50% of your personal best.   This information is not intended to replace advice given to you by your health care provider. Make sure you discuss any questions you have with your health care provider.   Document Released:  04/06/2008 Document Revised: 07/10/2015 Document Reviewed: 05/18/2013 Elsevier Interactive Patient Education [employer]

## 2015-10-30 NOTE — ED Provider Notes (Signed)
CSN: [id number]     Arrival date & time 10/30/15  9147 History   First MD Initiated Contact with Patient 10/30/15 [phone]     Chief Complaint  Patient presents with  . URI   cough, wheeze  HPI 81 year old female. Her son brings her in to [facility]. Son lives in [place]. Patient is visiting from [place]. 1 week of progressively worsening cough and chest congestion, runny nose. Cough is "wet" but nonproductive. Complains of wheezing and shortness of breath with the wheezing.  History of asthma, she's been using albuterol MDI rescue inhaler which helps the wheezing. Has not used albuterol rescue inhaler today. She continues using Advair twice a day. Also taking Robitussin. These don't seem to be helping. No definite fever or chills.  +  Nasal congestion +  Yellow, Discolored Post-nasal drainage and nasal discharge. No sinus pain/pressure No sore throat  +  cough Positive wheezing Positive chest congestion No hemoptysis Positive shortness of breath No pleuritic pain Denies chest pain No itchy/red eyes No earache  No nausea No vomiting No abdominal pain No diarrhea  No skin rashes +  Fatigue No myalgias No headache  No syncope. Past Medical History  Diagnosis Date  . Hypertension   . Chronic back pain   . RLS (restless legs syndrome)   . GERD (gastroesophageal reflux disease)   . Seasonal allergies    asthma Past Surgical History  Procedure Laterality Date  . Hip surgery    . Back surgery    . Cataract extraction     History reviewed. No pertinent family history. Social History  Substance Use Topics  . Smoking status: Never Smoker   . Smokeless tobacco: None  . Alcohol Use: No   OB History    No data available     Review of Systems  All other systems reviewed and are negative.   Allergies  Motrin  Home Medications   Prior to Admission medications   Medication Sig Start Date End Date Taking? Authorizing Provider  albuterol  (PROVENTIL HFA;VENTOLIN HFA) 108 (90 Base) MCG/ACT inhaler Inhale 2 puffs into the lungs every 4 (four) hours as needed for wheezing or shortness of breath. 10/30/15   Lajean Manes, MD  aspirin 81 MG chewable tablet Chew by mouth daily.    Historical Provider, MD  Biotin 1 MG CAPS Take by mouth.    Historical Provider, MD  Calcium-Phosphorus-Vitamin D (CITRACAL +D3 PO) Take by mouth.    Historical Provider, MD  Carboxymethylcellul-Glycerin (REFRESH OPTIVE OP) Apply to eye.    Historical Provider, MD  cefdinir (OMNICEF) 300 MG capsule Take 1 capsule (300 mg total) by mouth 2 (two) times daily. X 10 days 10/30/15   Lajean Manes, MD  etodolac (LODINE) 400 MG tablet Take 400 mg by mouth 2 (two) times daily.    Historical Provider, MD  ferrous fumarate (HEMOCYTE - 106 MG FE) 325 (106 FE) MG TABS tablet Take 1 tablet by mouth.    Historical Provider, MD  fexofenadine (ALLEGRA) 180 MG tablet Take 180 mg by mouth daily.    Historical Provider, MD  fluticasone (FLONASE) 50 MCG/ACT nasal spray Place into both nostrils daily.    Historical Provider, MD  Fluticasone-Salmeterol (ADVAIR) 100-50 MCG/DOSE AEPB Inhale 1 puff into the lungs 2 (two) times daily.    Historical Provider, MD  hydrochlorothiazide (HYDRODIURIL) 12.5 MG tablet Take 12.5 mg by mouth daily.    Historical Provider, MD  levETIRAcetam (KEPPRA) 250 MG tablet Take 250 mg by mouth  2 (two) times daily.    Historical Provider, MD  magnesium 30 MG tablet Take 500 mg by mouth 2 (two) times daily.    Historical Provider, MD  montelukast (SINGULAIR) 10 MG tablet Take 10 mg by mouth at bedtime.    Historical Provider, MD  oxybutynin (DITROPAN) 5 MG tablet Take 5 mg by mouth 3 (three) times daily.    Historical Provider, MD  pramipexole (MIRAPEX) 0.25 MG tablet Take 0.25 mg by mouth 3 (three) times daily.    Historical Provider, MD  predniSONE (DELTASONE) 20 MG tablet Take 1 tablet (20 mg total) by mouth 2 (two) times daily with a meal. For 5 days 10/30/15    Lajean Manes, MD  pregabalin (LYRICA) 150 MG capsule Take 150 mg by mouth 2 (two) times daily.    Historical Provider, MD  primidone (MYSOLINE) 50 MG tablet Take by mouth 4 (four) times daily.    Historical Provider, MD  propranolol (INDERAL) 60 MG tablet Take 60 mg by mouth 3 (three) times daily.    Historical Provider, MD  RABEprazole (ACIPHEX) 20 MG tablet Take 20 mg by mouth daily.    Historical Provider, MD  simvastatin (ZOCOR) 20 MG tablet Take 20 mg by mouth daily.    Historical Provider, MD  vitamin E 100 UNIT capsule Take by mouth daily.    Historical Provider, MD  Zinc 50 MG CAPS Take by mouth.    Historical Provider, MD   Meds Ordered and Administered this Visit   Medications  ipratropium-albuterol (DUONEB) 0.5-2.5 (3) MG/3ML nebulizer solution 3 mL (3 mLs Nebulization Given 10/30/15 1049)  cefTRIAXone (ROCEPHIN) injection 500 mg (500 mg Intramuscular Given 10/30/15 1119)  methylPREDNISolone acetate (DEPO-MEDROL) injection 80 mg (80 mg Intramuscular Given 10/30/15 1119)    BP 115/63 mmHg  Pulse 83  Temp(Src) 98.6 F (37 C) (Oral)  Resp 20  SpO2 94% No data found.   Physical Exam  Constitutional: She is oriented to person, place, and time. She appears well-developed and well-nourished. No distress.  Mild respiratory distress from wheezing. Alert, cooperative. O2 saturation 90%  HENT:  Head: Normocephalic and atraumatic.  Right Ear: Tympanic membrane, external ear and ear canal normal.  Left Ear: Tympanic membrane, external ear and ear canal normal.  Nose: Mucosal edema and rhinorrhea present. Right sinus exhibits maxillary sinus tenderness. Left sinus exhibits maxillary sinus tenderness.  Mouth/Throat: Oropharynx is clear and moist. No oral lesions. No oropharyngeal exudate.  Eyes: Right eye exhibits no discharge. Left eye exhibits no discharge. No scleral icterus.  Neck: Neck supple.  Cardiovascular: Normal rate, regular rhythm and normal heart sounds.    Pulmonary/Chest: Effort normal. She has wheezes (severe diffuse wheezes. Fair air movement bilaterally). She has rhonchi. She has no rales.  No definite rales  Musculoskeletal: She exhibits no tenderness.  Lymphadenopathy:    She has no cervical adenopathy.  Neurological: She is alert and oriented to person, place, and time. No cranial nerve deficit.  Skin: Skin is warm and dry. No rash noted.  Psychiatric: She has a normal mood and affect.  Nursing note and vitals reviewed.   Urgent Care Course DuoNeb nebulizer treatment ordered   Procedures (including critical care time)  Labs Review Labs Reviewed - No data to display  Imaging Review Dg Chest 2 View  10/30/2015  CLINICAL DATA:  Cough, congestion EXAM: CHEST  2 VIEW COMPARISON:  None. FINDINGS: Kyphotic deformity. Low lung volumes. No focal consolidation. No pleural effusion or pneumothorax. Heart is normal in size.  Multiple mid/lower thoracic vertebral compression fracture deformities, moderate to severe. IMPRESSION: No evidence of acute cardiopulmonary disease. Multiple mid/lower thoracic vertebral compression fracture deformities, moderate to severe. Electronically Signed   By: Charline BillsSriyesh  Krishnan M.D.   On: 10/30/2015 10:37    MDM Chest x-ray shows no acute cardiopulmonary disease. No infiltrates.    1. Acute bronchitis with bronchospasm   2. Asthma with acute exacerbation, moderate persistent    Treatment options discussed, as well as risks, benefits, alternatives. Patient and son voiced understanding and agreement with the following plans:  DuoNeb nebulizer treatment given. Wheezing improved. Oxygen saturation rechecked, and improved at 94%  Rocephin 500 IM,  Depo-Medrol 80 mg IM  Prescriptions for: Omnicef 300 twice a day Prednisone 20 mg by mouth twice a day 5 days Other symptomatic care discussed and written instructions given in AVS. See detailed instructions in AVS, which were given to patient and son.  Verbal  instructions also given. Questions invited and answered.  They voiced understanding and agreement with plans.   Lajean Manesavid Massey, MD 10/30/15 [id number]

## 2015-10-30 NOTE — ED Notes (Signed)
Pt c/o 1 week of cough, congestion runny nose. Cough is wet but non-productive. H/o asthma, using rescue inhaler. C/o SOB. Taken Robitussin otc. Afebrile.

## 2016-03-19 ENCOUNTER — Ambulatory Visit (INDEPENDENT_AMBULATORY_CARE_PROVIDER_SITE_OTHER): Payer: Medicare Other | Admitting: Physician Assistant

## 2016-03-19 ENCOUNTER — Encounter: Payer: Self-pay | Admitting: Physician Assistant

## 2016-03-19 VITALS — BP 139/59 | HR 71 | Wt 113.0 lb

## 2016-03-19 DIAGNOSIS — I10 Essential (primary) hypertension: Secondary | ICD-10-CM

## 2016-03-19 DIAGNOSIS — E785 Hyperlipidemia, unspecified: Secondary | ICD-10-CM | POA: Diagnosis not present

## 2016-03-19 DIAGNOSIS — N3281 Overactive bladder: Secondary | ICD-10-CM

## 2016-03-19 DIAGNOSIS — R0982 Postnasal drip: Secondary | ICD-10-CM

## 2016-03-19 DIAGNOSIS — G2581 Restless legs syndrome: Secondary | ICD-10-CM | POA: Insufficient documentation

## 2016-03-19 DIAGNOSIS — R569 Unspecified convulsions: Secondary | ICD-10-CM

## 2016-03-19 DIAGNOSIS — E039 Hypothyroidism, unspecified: Secondary | ICD-10-CM

## 2016-03-19 DIAGNOSIS — J302 Other seasonal allergic rhinitis: Secondary | ICD-10-CM

## 2016-03-19 DIAGNOSIS — K219 Gastro-esophageal reflux disease without esophagitis: Secondary | ICD-10-CM | POA: Insufficient documentation

## 2016-03-19 DIAGNOSIS — M5416 Radiculopathy, lumbar region: Secondary | ICD-10-CM

## 2016-03-19 DIAGNOSIS — J4520 Mild intermittent asthma, uncomplicated: Secondary | ICD-10-CM

## 2016-03-19 DIAGNOSIS — J449 Chronic obstructive pulmonary disease, unspecified: Secondary | ICD-10-CM | POA: Insufficient documentation

## 2016-03-19 MED ORDER — ETODOLAC 400 MG PO TABS: 400.0000 mg | ORAL_TABLET | Freq: Two times a day (BID) | ORAL | Status: DC

## 2016-03-19 MED ORDER — PREGABALIN 150 MG PO CAPS: 150.0000 mg | ORAL_CAPSULE | Freq: Two times a day (BID) | ORAL | Status: DC

## 2016-03-19 MED ORDER — HYDROCHLOROTHIAZIDE 12.5 MG PO TABS: 12.5000 mg | ORAL_TABLET | Freq: Every day | ORAL | Status: DC

## 2016-03-19 MED ORDER — PRIMIDONE 50 MG PO TABS: 50.0000 mg | ORAL_TABLET | Freq: Three times a day (TID) | ORAL | Status: DC

## 2016-03-19 MED ORDER — FLUTICASONE PROPIONATE 50 MCG/ACT NA SUSP: 2.0000 | Freq: Every day | NASAL | Status: AC

## 2016-03-19 MED ORDER — RABEPRAZOLE SODIUM 20 MG PO TBEC: 20.0000 mg | DELAYED_RELEASE_TABLET | Freq: Two times a day (BID) | ORAL | Status: DC

## 2016-03-19 MED ORDER — MONTELUKAST SODIUM 10 MG PO TABS: 10.0000 mg | ORAL_TABLET | Freq: Every day | ORAL | Status: DC

## 2016-03-19 MED ORDER — FEXOFENADINE HCL 180 MG PO TABS: 180.0000 mg | ORAL_TABLET | Freq: Every day | ORAL | Status: DC

## 2016-03-19 MED ORDER — PROPRANOLOL HCL 60 MG PO TABS: 60.0000 mg | ORAL_TABLET | Freq: Every day | ORAL | Status: DC

## 2016-03-19 MED ORDER — LEVETIRACETAM 250 MG PO TABS: 250.0000 mg | ORAL_TABLET | Freq: Two times a day (BID) | ORAL | Status: DC

## 2016-03-19 MED ORDER — SIMVASTATIN 20 MG PO TABS: 20.0000 mg | ORAL_TABLET | Freq: Every day | ORAL | Status: DC

## 2016-03-19 MED ORDER — PRAMIPEXOLE DIHYDROCHLORIDE 0.25 MG PO TABS: 0.2500 mg | ORAL_TABLET | Freq: Every day | ORAL | Status: DC

## 2016-03-19 MED ORDER — OXYBUTYNIN CHLORIDE 5 MG PO TABS: 5.0000 mg | ORAL_TABLET | Freq: Every day | ORAL | Status: DC

## 2016-03-19 MED ORDER — FLUTICASONE-SALMETEROL 250-50 MCG/DOSE IN AEPB: 1.0000 | INHALATION_SPRAY | Freq: Two times a day (BID) | RESPIRATORY_TRACT | Status: DC

## 2016-03-19 NOTE — Progress Notes (Signed)
   Subjective:    Patient ID: Jamie Garcia, female    DOB: [birth year], 82 y.o.   MRN: [id number]  HPI Pt is a 82 yo female who presents to the clinic to establish care.   .. Active Ambulatory Problems    Diagnosis Date Noted  . Lumbar radiculopathy 03/19/2016  . Asthma, mild intermittent 03/19/2016  . Gastroesophageal reflux disease without esophagitis 03/19/2016  . RLS (restless legs syndrome) 03/19/2016  . Essential hypertension, benign 03/19/2016  . Seasonal allergies 03/19/2016  . OAB (overactive bladder) 03/19/2016  . Hyperlipidemia 03/19/2016  . Seizures (HCC) 03/19/2016  . Post-nasal drip 03/19/2016   Resolved Ambulatory Problems    Diagnosis Date Noted  . No Resolved Ambulatory Problems   Past Medical History  Diagnosis Date  . Hypertension   . Chronic back pain   . GERD (gastroesophageal reflux disease)    .Marland Kitchen. Social History   Social History  . Marital Status: Widowed    Spouse Name: N/A  . Number of Children: N/A  . Years of Education: N/A   Occupational History  . Not on file.   Social History Main Topics  . Smoking status: Never Smoker   . Smokeless tobacco: Not on file  . Alcohol Use: No  . Drug Use: No  . Sexual Activity: No   Other Topics Concern  . Not on file   Social History Narrative   [facility]   She moved here from [place] to live with her son.   She is feeling well today despite some drainage down the back of her throat. She has allergies. She denies any fever, chills, sinus pressure, cough, ear pain or sore throat.    Review of Systems  All other systems reviewed and are negative.      Objective:   Physical Exam  Constitutional: She is oriented to person, place, and time. She appears well-developed and well-nourished.  HENT:  Head: Normocephalic and atraumatic.  Right Ear: External ear normal.  Left Ear: External ear normal.  Nose: Nose normal.  Mouth/Throat: No oropharyngeal exudate.  PND  present on oropharynx.  No tenderness over maxillary or frontal sinuses.   Eyes:  Watery discharge bilateral eyes.   Neck: Normal range of motion. Neck supple.  Cardiovascular: Normal rate, regular rhythm and normal heart sounds.   Pulmonary/Chest: Effort normal and breath sounds normal. She has no wheezes.  Lymphadenopathy:    She has no cervical adenopathy.  Neurological: She is alert and oriented to person, place, and time.  Psychiatric: She has a normal mood and affect. Her behavior is normal.          Assessment & Plan:  Seizures- refilled keppra.   Thyroid activity decreased- subclinical hypothyroidism from previous labs. Will recheck in 3 months.   Seasonal allergies/PND- discussed local honey to help with allergies. Refilled allegra and flonase. Will try astelin 1 spray bid.(her son has a few bottles at home). mucinex as needed. If symptoms worsening call.   RLS- refilled mirapex.   OAB- refilled ditropan.   Lumbar radiculopathy- refilled etodolac and lyrica.   Hyperlipidemia- refilled zocor. Per pt last labs febuary.   GERD- refilled aciphex.   HTN- refilled propranolol and HCTZ  Asthma- refilled advair.

## 2016-03-19 NOTE — Patient Instructions (Addendum)
mucinex as needed.  Astelin twice a day with flonase.

## 2016-03-23 ENCOUNTER — Telehealth: Payer: Self-pay | Admitting: *Deleted

## 2016-03-23 NOTE — Telephone Encounter (Signed)
PA initiated for Lyrica. Form faxed

## 2016-03-25 ENCOUNTER — Telehealth: Payer: Self-pay

## 2016-03-25 NOTE — Telephone Encounter (Signed)
Exended release is fine. Pt is a new patient and brought in list of medications. Clearly there were some errors.

## 2016-03-25 NOTE — Telephone Encounter (Signed)
Notified [facility].

## 2016-04-14 NOTE — Telephone Encounter (Signed)
Checked status approval # [id number]. It was approved the same day, 03/23/16 and mailed out to the patient on 5/23. The patient should have received a 90 day supply

## 2016-04-15 ENCOUNTER — Encounter: Payer: Self-pay | Admitting: Physician Assistant

## 2016-04-15 ENCOUNTER — Ambulatory Visit (INDEPENDENT_AMBULATORY_CARE_PROVIDER_SITE_OTHER): Payer: Medicare Other | Admitting: Physician Assistant

## 2016-04-15 VITALS — BP 144/61 | HR 72 | Temp 98.6°F

## 2016-04-15 DIAGNOSIS — J4521 Mild intermittent asthma with (acute) exacerbation: Secondary | ICD-10-CM | POA: Diagnosis not present

## 2016-04-15 MED ORDER — ALBUTEROL SULFATE HFA 108 (90 BASE) MCG/ACT IN AERS: 2.0000 | INHALATION_SPRAY | Freq: Four times a day (QID) | RESPIRATORY_TRACT | Status: DC | PRN

## 2016-04-15 MED ORDER — DOXYCYCLINE HYCLATE 100 MG PO TABS: 100.0000 mg | ORAL_TABLET | Freq: Two times a day (BID) | ORAL | Status: DC

## 2016-04-15 MED ORDER — PREDNISONE 20 MG PO TABS: ORAL_TABLET | ORAL | Status: DC

## 2016-04-15 NOTE — Patient Instructions (Signed)

## 2016-04-17 ENCOUNTER — Encounter: Payer: Self-pay | Admitting: Physician Assistant

## 2016-04-17 NOTE — Progress Notes (Signed)
   Subjective:    Patient ID: Blythe StanfordJane Renken, female    DOB: 1920-07-13, 82 y.o.   MRN: [id number]  HPI Pt is a 82 yo female with asthma who presents with cough. Symptoms have been present for 5 days. She is using her daily advair and has increased her use of rescue inhaler. She has also been taking mucinex. No fever, chills, body aches. She was exposed to children with upper respiratory infections.    Review of Systems  All other systems reviewed and are negative.      Objective:   Physical Exam  Constitutional: She is oriented to person, place, and time. She appears well-developed and well-nourished.  HENT:  Head: Normocephalic and atraumatic.  Right Ear: External ear normal.  Left Ear: External ear normal.  Nose: Nose normal.  Mouth/Throat: Oropharynx is clear and moist. No oropharyngeal exudate.  Eyes:  Redness and swelling around outside of both eyes.   Neck: Normal range of motion. Neck supple.  Cardiovascular: Normal rate, regular rhythm and normal heart sounds.   Pulmonary/Chest: Effort normal.  Scattered rhonchi with some wheezing at the base.   Lymphadenopathy:    She has no cervical adenopathy.  Neurological: She is alert and oriented to person, place, and time.  Psychiatric: She has a normal mood and affect. Her behavior is normal.          Assessment & Plan:  Asthmatic bronchitis-pulse ox 98 percent and reassuring.  treated with doxycycline and prednisone. Continue mucinex. Follow up as needed. Continue daily inhalers. rescule inhaler as needed.

## 2016-04-20 ENCOUNTER — Encounter: Payer: Self-pay | Admitting: *Deleted

## 2016-04-20 ENCOUNTER — Emergency Department (INDEPENDENT_AMBULATORY_CARE_PROVIDER_SITE_OTHER)
Admission: EM | Admit: 2016-04-20 | Discharge: 2016-04-20 | Disposition: A | Discharge status: Home / Self Care | Payer: Medicare Other | Source: Home / Self Care | Attending: Family Medicine | Admitting: Family Medicine

## 2016-04-20 ENCOUNTER — Emergency Department (INDEPENDENT_AMBULATORY_CARE_PROVIDER_SITE_OTHER): Payer: Medicare Other

## 2016-04-20 DIAGNOSIS — R05 Cough: Secondary | ICD-10-CM

## 2016-04-20 DIAGNOSIS — R053 Chronic cough: Secondary | ICD-10-CM

## 2016-04-20 DIAGNOSIS — R0600 Dyspnea, unspecified: Secondary | ICD-10-CM

## 2016-04-20 MED ORDER — AZITHROMYCIN 250 MG PO TABS: ORAL_TABLET | ORAL | Status: DC

## 2016-04-20 NOTE — Discharge Instructions (Signed)
Take plain guaifenesin (600 or1200mg  extended release tabs such as Mucinex) twice daily, with plenty of water, for cough and congestion.  Get adequate rest.   May use Afrin nasal spray (or generic oxymetazoline) once daily for about 5 days and then discontinue.  Also recommend using saline nasal spray several times daily and saline nasal irrigation (AYR is a common brand).  Use Flonase nasal spray each morning after using Afrin nasal spray and saline nasal irrigation.  May also use Astelin spray Try warm salt water gargles for sore throat.  Stop all antihistamines for now, and other non-prescription cough/cold preparations.  Stop oxybutynin for now. May take Delsym Cough Suppressant at bedtime for nighttime cough.  Follow-up with family doctor if not improving about one week.

## 2016-04-20 NOTE — ED Provider Notes (Signed)
CSN: [id number]     Arrival date & time 04/20/16  1449 History   First MD Initiated Contact with Patient 04/20/16 1543     Chief Complaint  Patient presents with  . Cough      HPI Comments: Patient developed a URI about 8 days ago and was treated with prednisone burst and doxycycline 5 days ago.  She complains of persistent non-productive cough and shortness of breath with activity.  No fevers, chills, and sweats.  No pleuritic pain.  The history is provided by the patient and a relative.    Past Medical History  Diagnosis Date  . Hypertension   . Chronic back pain   . RLS (restless legs syndrome)   . GERD (gastroesophageal reflux disease)   . Seasonal allergies    Past Surgical History  Procedure Laterality Date  . Hip surgery    . Back surgery    . Cataract extraction     History reviewed. No pertinent family history. Social History  Substance Use Topics  . Smoking status: Never Smoker   . Smokeless tobacco: None  . Alcohol Use: No   OB History    No data available     Review of Systems No sore throat + cough No pleuritic pain + wheezing + nasal congestion ? post-nasal drainage + sinus pain/pressure No itchy/red eyes No earache No hemoptysis + SOB No fever/chills No nausea No vomiting No abdominal pain No diarrhea No urinary symptoms No skin rash + fatigue No myalgias No headache Used OTC meds without relief  Allergies  Motrin  Home Medications   Prior to Admission medications   Medication Sig Start Date End Date Taking? Authorizing Provider  albuterol (PROVENTIL HFA;VENTOLIN HFA) 108 (90 Base) MCG/ACT inhaler Inhale 2 puffs into the lungs every 6 (six) hours as needed for wheezing or shortness of breath. 04/15/16   Jomarie Longs, PA-C  aspirin 81 MG chewable tablet Chew by mouth daily.    Historical Provider, MD  azithromycin (ZITHROMAX Z-PAK) 250 MG tablet Take 2 tabs today; then begin one tab once daily for 4 more days. 04/20/16   Lattie Haw, MD  Biotin 1 MG CAPS Take 5,000 mcg by mouth.     Historical Provider, MD  Calcium-Phosphorus-Vitamin D (CITRACAL +D3 PO) Take by mouth.    Historical Provider, MD  Carboxymethylcellul-Glycerin (REFRESH OPTIVE OP) Apply to eye.    Historical Provider, MD  doxycycline (VIBRA-TABS) 100 MG tablet Take 1 tablet (100 mg total) by mouth 2 (two) times daily. For 10 days. 04/15/16   Jade L Breeback, PA-C  etodolac (LODINE) 400 MG tablet Take 1 tablet (400 mg total) by mouth 2 (two) times daily. 03/19/16   Jade L Breeback, PA-C  ferrous fumarate (HEMOCYTE - 106 MG FE) 325 (106 FE) MG TABS tablet Take 1 tablet by mouth.    Historical Provider, MD  fexofenadine (ALLEGRA) 180 MG tablet Take 1 tablet (180 mg total) by mouth daily. 03/19/16   Jade L Breeback, PA-C  fluticasone (FLONASE) 50 MCG/ACT nasal spray Place 2 sprays into both nostrils daily. 03/19/16   Jade L Breeback, PA-C  Fluticasone-Salmeterol (ADVAIR DISKUS) 250-50 MCG/DOSE AEPB Inhale 1 puff into the lungs 2 (two) times daily. 03/19/16   Jade L Breeback, PA-C  hydrochlorothiazide (HYDRODIURIL) 12.5 MG tablet Take 1 tablet (12.5 mg total) by mouth daily. 03/19/16   Jade L Breeback, PA-C  levETIRAcetam (KEPPRA) 250 MG tablet Take 1 tablet (250 mg total) by mouth 2 (two) times  daily. 03/19/16   Jomarie Longs, PA-C  magnesium 30 MG tablet Take 500 mg by mouth 2 (two) times daily.    Historical Provider, MD  montelukast (SINGULAIR) 10 MG tablet Take 1 tablet (10 mg total) by mouth at bedtime. 03/19/16   Jade L Breeback, PA-C  oxybutynin (DITROPAN) 5 MG tablet Take 1 tablet (5 mg total) by mouth daily. 03/19/16   Jade L Breeback, PA-C  pramipexole (MIRAPEX) 0.25 MG tablet Take 1 tablet (0.25 mg total) by mouth at bedtime. 03/19/16   Jade L Breeback, PA-C  predniSONE (DELTASONE) 20 MG tablet Take 2 tablets for 5 days. 04/15/16   Jade L Breeback, PA-C  pregabalin (LYRICA) 150 MG capsule Take 1 capsule (150 mg total) by mouth 2 (two) times daily. 03/19/16    Jade L Breeback, PA-C  primidone (MYSOLINE) 50 MG tablet Take 1 tablet (50 mg total) by mouth 3 (three) times daily. 03/19/16   Jade L Breeback, PA-C  propranolol (INDERAL) 60 MG tablet Take 1 tablet (60 mg total) by mouth daily. 03/19/16   Jade L Breeback, PA-C  RABEprazole (ACIPHEX) 20 MG tablet Take 1 tablet (20 mg total) by mouth 2 (two) times daily. 03/19/16   Jade L Breeback, PA-C  simvastatin (ZOCOR) 20 MG tablet Take 1 tablet (20 mg total) by mouth daily. 03/19/16   Jade L Breeback, PA-C  vitamin E 100 UNIT capsule Take by mouth daily.    Historical Provider, MD  Zinc 50 MG CAPS Take by mouth.    Historical Provider, MD   Meds Ordered and Administered this Visit  Medications - No data to display  BP 123/62 mmHg  Pulse 72  Temp(Src) 98.2 F (36.8 C) (Oral)  Wt 112 lb (50.803 kg)  SpO2 92% No data found.   Physical Exam Nursing notes and Vital Signs reviewed. Appearance:  Patient appears cachectic, but in no acute distress.  She is alert and oriented.  Eyes:  Pupils are equal, round, and reactive to light and accomodation.  Extraocular movement is intact.  Conjunctivae are not inflamed  Ears:  Canals normal.  Tympanic membranes normal.  Nose:  Mildly congested turbinates.   Maxillary sinus tenderness is present.  Mouth/Pharynx:  Normal; moist mucous membranes  Neck:  Supple.   No adenoopathy. Lungs:   Breath sounds are decreased but equal.  Moving air well. Chest:  Distinct tenderness to palpation over the mid-sternum.  Heart:  Regular rate and rhythm without murmurs, rubs, or gallops.  Abdomen:  Nontender without masses or hepatosplenomegaly.  Bowel sounds are present.  No CVA or flank tenderness.  Extremities:  No edema.  Skin:  No rash present.   ED Course  Procedures (including critical care time)  Labs Review Labs Reviewed - No data to display  Imaging Review Dg Chest 2 View  04/20/2016  CLINICAL DATA:  Persistent cough and dyspnea for 8 days. EXAM: CHEST  2 VIEW  COMPARISON:  10/30/2015 chest radiograph. FINDINGS: Stable cardiomediastinal silhouette with normal heart size. No pneumothorax. No pleural effusion. No pulmonary edema. No acute consolidative airspace disease. Mild scarring versus atelectasis at the anterior lung bases. Four stable severe chronic vertebral compression fractures in the mid to lower thoracic spine. IMPRESSION: Mild scarring versus atelectasis at the anterior lung bases. Otherwise no active cardiopulmonary disease. Multiple stable severe chronic vertebral compression fractures in the mid to lower thoracic spine. Electronically Signed   By: Delbert Phenix M.D.   On: 04/20/2016 16:15      MDM  1. Persistent dry cough; either post-infectious and/or bronchospasm   Will empirically begin Z-pak. Take plain guaifenesin (600 or1200mg  extended release tabs such as Mucinex) twice daily, with plenty of water, for cough and congestion.  Get adequate rest.   May use Afrin nasal spray (or generic oxymetazoline) once daily for about 5 days and then discontinue.  Also recommend using saline nasal spray several times daily and saline nasal irrigation (AYR is a common brand).  Use Flonase nasal spray each morning after using Afrin nasal spray and saline nasal irrigation.  May also use Astelin spray Try warm salt water gargles for sore throat.  Stop all antihistamines for now, and other non-prescription cough/cold preparations.  Stop oxybutynin for now. May take Delsym Cough Suppressant at bedtime for nighttime cough.  Follow-up with family doctor if not improving about one week.    Lattie HawStephen A Ziyan Hillmer, MD 04/20/16 [phone]

## 2016-04-20 NOTE — ED Notes (Signed)
Pt was seen by PCP on 04/17/16 for bronchitis. Given doxy and prednisone. Reports she is not having any improvement in her symptoms. Cough is persistent. Also taking Mucinex DM.

## 2016-04-22 ENCOUNTER — Telehealth: Payer: Self-pay | Admitting: Emergency Medicine

## 2016-05-04 ENCOUNTER — Other Ambulatory Visit: Payer: Self-pay | Admitting: *Deleted

## 2016-05-04 MED ORDER — PROPRANOLOL HCL ER 60 MG PO CP24
60.0000 mg | ORAL_CAPSULE | Freq: Every day | ORAL | Status: DC
Start: 2016-05-04 — End: 2016-10-06

## 2016-07-17 ENCOUNTER — Ambulatory Visit (INDEPENDENT_AMBULATORY_CARE_PROVIDER_SITE_OTHER): Payer: Medicare Other | Admitting: Physician Assistant

## 2016-07-17 ENCOUNTER — Encounter: Payer: Self-pay | Admitting: Physician Assistant

## 2016-07-17 VITALS — BP 164/68 | HR 78 | Ht <= 58 in | Wt 114.0 lb

## 2016-07-17 DIAGNOSIS — M1612 Unilateral primary osteoarthritis, left hip: Secondary | ICD-10-CM | POA: Diagnosis not present

## 2016-07-17 DIAGNOSIS — J069 Acute upper respiratory infection, unspecified: Secondary | ICD-10-CM

## 2016-07-17 DIAGNOSIS — Z79899 Other long term (current) drug therapy: Secondary | ICD-10-CM

## 2016-07-17 DIAGNOSIS — E039 Hypothyroidism, unspecified: Secondary | ICD-10-CM

## 2016-07-17 DIAGNOSIS — E785 Hyperlipidemia, unspecified: Secondary | ICD-10-CM

## 2016-07-17 DIAGNOSIS — M79645 Pain in left finger(s): Secondary | ICD-10-CM

## 2016-07-17 DIAGNOSIS — G8929 Other chronic pain: Secondary | ICD-10-CM

## 2016-07-17 DIAGNOSIS — Z0000 Encounter for general adult medical examination without abnormal findings: Secondary | ICD-10-CM

## 2016-07-17 MED ORDER — TRAMADOL HCL 50 MG PO TABS: 50.0000 mg | ORAL_TABLET | Freq: Three times a day (TID) | ORAL | 0 refills | Status: AC | PRN

## 2016-07-17 MED ORDER — AMOXICILLIN-POT CLAVULANATE 875-125 MG PO TABS: 1.0000 | ORAL_TABLET | Freq: Two times a day (BID) | ORAL | 0 refills | Status: DC

## 2016-07-17 MED ORDER — AMBULATORY NON FORMULARY MEDICATION: 0 refills | Status: AC

## 2016-07-17 MED ORDER — AMBULATORY NON FORMULARY MEDICATION: 0 refills | Status: DC

## 2016-07-17 MED ORDER — DICLOFENAC SODIUM 1 % TD GEL: 4.0000 g | Freq: Four times a day (QID) | TRANSDERMAL | 1 refills | Status: AC

## 2016-07-17 NOTE — Patient Instructions (Signed)
Cough- honey and delsym.    Upper Respiratory Infection, Adult Most upper respiratory infections (URIs) are a viral infection of the air passages leading to the lungs. A URI affects the nose, throat, and upper air passages. The most common type of URI is nasopharyngitis and is typically referred to as "the common cold." URIs run their course and usually go away on their own. Most of the time, a URI does not require medical attention, but sometimes a bacterial infection in the upper airways can follow a viral infection. This is called a secondary infection. Sinus and middle ear infections are common types of secondary upper respiratory infections. Bacterial pneumonia can also complicate a URI. A URI can worsen asthma and chronic obstructive pulmonary disease (COPD). Sometimes, these complications can require emergency medical care and may be life threatening.  CAUSES Almost all URIs are caused by viruses. A virus is a type of germ and can spread from one person to another.  RISKS FACTORS You may be at risk for a URI if:   You smoke.   You have chronic heart or lung disease.  You have a weakened defense (immune) system.   You are very young or very old.   You have nasal allergies or asthma.  You work in crowded or poorly ventilated areas.  You work in health care facilities or schools. SIGNS AND SYMPTOMS  Symptoms typically develop 2-3 days after you come in contact with a cold virus. Most viral URIs last 7-10 days. However, viral URIs from the influenza virus (flu virus) can last 14-18 days and are typically more severe. Symptoms may include:   Runny or stuffy (congested) nose.   Sneezing.   Cough.   Sore throat.   Headache.   Fatigue.   Fever.   Loss of appetite.   Pain in your forehead, behind your eyes, and over your cheekbones (sinus pain).  Muscle aches.  DIAGNOSIS  Your health care provider may diagnose a URI by:  Physical exam.  Tests to check  that your symptoms are not due to another condition such as:  Strep throat.  Sinusitis.  Pneumonia.  Asthma. TREATMENT  A URI goes away on its own with time. It cannot be cured with medicines, but medicines may be prescribed or recommended to relieve symptoms. Medicines may help:  Reduce your fever.  Reduce your cough.  Relieve nasal congestion. HOME CARE INSTRUCTIONS   Take medicines only as directed by your health care provider.   Gargle warm saltwater or take cough drops to comfort your throat as directed by your health care provider.  Use a warm mist humidifier or inhale steam from a shower to increase air moisture. This may make it easier to breathe.  Drink enough fluid to keep your urine clear or pale yellow.   Eat soups and other clear broths and maintain good nutrition.   Rest as needed.   Return to work when your temperature has returned to normal or as your health care provider advises. You may need to stay home longer to avoid infecting others. You can also use a face mask and careful hand washing to prevent spread of the virus.  Increase the usage of your inhaler if you have asthma.   Do not use any tobacco products, including cigarettes, chewing tobacco, or electronic cigarettes. If you need help quitting, ask your health care provider. PREVENTION  The best way to protect yourself from getting a cold is to practice good hygiene.   Avoid oral  or hand contact with people with cold symptoms.   Wash your hands often if contact occurs.  There is no clear evidence that vitamin C, vitamin E, echinacea, or exercise reduces the chance of developing a cold. However, it is always recommended to get plenty of rest, exercise, and practice good nutrition.  SEEK MEDICAL CARE IF:   You are getting worse rather than better.   Your symptoms are not controlled by medicine.   You have chills.  You have worsening shortness of breath.  You have brown or red  mucus.  You have yellow or brown nasal discharge.  You have pain in your face, especially when you bend forward.  You have a fever.  You have swollen neck glands.  You have pain while swallowing.  You have white areas in the back of your throat. SEEK IMMEDIATE MEDICAL CARE IF:   You have severe or persistent:  Headache.  Ear pain.  Sinus pain.  Chest pain.  You have chronic lung disease and any of the following:  Wheezing.  Prolonged cough.  Coughing up blood.  A change in your usual mucus.  You have a stiff neck.  You have changes in your:  Vision.  Hearing.  Thinking.  Mood. MAKE SURE YOU:   Understand these instructions.  Will watch your condition.  Will get help right away if you are not doing well or get worse.   This information is not intended to replace advice given to you by your health care provider. Make sure you discuss any questions you have with your health care provider.   Document Released: 04/14/2001 Document Revised: 03/05/2015 Document Reviewed: 01/24/2014 Elsevier Interactive Patient Education [employer].

## 2016-07-17 NOTE — Progress Notes (Signed)
   Subjective:    Patient ID: Jamie StanfordJane Garcia, female    DOB: [birth year], 82 y.o.   MRN: [id number]  HPI  Pt presents to the clinic with her son with 2 weeks of cough, facial pain and headache. Cough is worse at night. No fever, chills, body aches. Some pain also behind right ear. Albuterol helps but having trouble getting medication in. Delsym helping some.    Pt has left hip pain and chronic left thumb pain. She takes etodolac. Pain comes and goes.    Review of Systems See HPI.     Objective:   Physical Exam  Constitutional: She is oriented to person, place, and time. She appears well-developed and well-nourished.  HENT:  Head: Normocephalic and atraumatic.  Right Ear: External ear normal.  Left Ear: External ear normal.  Mouth/Throat: Oropharynx is clear and moist. No oropharyngeal exudate.  TM's clear.  Nasal turbinates red and swollen.   Eyes: Conjunctivae are normal.  Bilateral erythema around eyes with watery discharge.   Neck: Normal range of motion. Neck supple.  Cardiovascular: Normal rate, regular rhythm and normal heart sounds.   Pulmonary/Chest: Effort normal and breath sounds normal. She has no wheezes. She has no rales.  Lymphadenopathy:    She has no cervical adenopathy.  Neurological: She is alert and oriented to person, place, and time.  Psychiatric: She has a normal mood and affect. Her behavior is normal.          Assessment & Plan:  Acute URI- augmentin started. Discussed symptomatic care. Continue albuterol. rx given for spacer. Delsym for cough.   Primary OA of left hip/chronic pain of left thumb- on etodolac. Will check CmP. voltaren gel sent to use. Consider injections with sports medicine.   Hyperlipidemia- will recheck.

## 2016-07-18 LAB — LIPID PANEL
Cholesterol: 152 mg/dL (ref 125–200)
HDL: 64 mg/dL (ref >=46)
LDL CALC: 66 mg/dL (ref <130)
Total CHOL/HDL Ratio: 2.4 Ratio (ref <=5.0)
Triglycerides: 112 mg/dL (ref <150)
VLDL: 22 mg/dL (ref <30)

## 2016-07-18 LAB — COMPLETE METABOLIC PANEL WITH GFR
ALK PHOS: 82 U/L (ref 33–130)
ALT: 19 U/L (ref 6–29)
AST: 30 U/L (ref 10–35)
Albumin: 3.2 g/dL — ABNORMAL LOW (ref 3.6–5.1)
BILIRUBIN TOTAL: 0.7 mg/dL (ref 0.2–1.2)
BUN: 35 mg/dL — AB (ref 7–25)
CO2: 23 mmol/L (ref 20–31)
CREATININE: 1.18 mg/dL — AB (ref 0.60–0.88)
Calcium: 8.2 mg/dL — ABNORMAL LOW (ref 8.6–10.4)
Chloride: 101 mmol/L (ref 98–110)
GFR, EST NON AFRICAN AMERICAN: 40 mL/min — AB (ref >=60)
GFR, Est African American: 46 mL/min — ABNORMAL LOW (ref >=60)
GLUCOSE: 68 mg/dL (ref 65–99)
Potassium: 5.1 mmol/L (ref 3.5–5.3)
SODIUM: 137 mmol/L (ref 135–146)
TOTAL PROTEIN: 6.1 g/dL (ref 6.1–8.1)

## 2016-07-18 LAB — TSH: TSH: 5.46 mIU/L — ABNORMAL HIGH

## 2016-07-20 ENCOUNTER — Other Ambulatory Visit: Payer: Self-pay | Admitting: Physician Assistant

## 2016-07-20 ENCOUNTER — Encounter: Payer: Self-pay | Admitting: Physician Assistant

## 2016-07-20 DIAGNOSIS — N183 Chronic kidney disease, stage 3 unspecified: Secondary | ICD-10-CM | POA: Insufficient documentation

## 2016-07-20 DIAGNOSIS — G8929 Other chronic pain: Secondary | ICD-10-CM | POA: Insufficient documentation

## 2016-07-20 DIAGNOSIS — M1612 Unilateral primary osteoarthritis, left hip: Secondary | ICD-10-CM | POA: Insufficient documentation

## 2016-07-20 DIAGNOSIS — M79645 Pain in left finger(s): Secondary | ICD-10-CM

## 2016-07-20 MED ORDER — LEVOTHYROXINE SODIUM 50 MCG PO TABS: 50.0000 ug | ORAL_TABLET | Freq: Every day | ORAL | 0 refills | Status: DC

## 2016-07-30 ENCOUNTER — Encounter: Payer: Self-pay | Admitting: Family Medicine

## 2016-07-30 ENCOUNTER — Ambulatory Visit (INDEPENDENT_AMBULATORY_CARE_PROVIDER_SITE_OTHER): Payer: Medicare Other

## 2016-07-30 ENCOUNTER — Ambulatory Visit (INDEPENDENT_AMBULATORY_CARE_PROVIDER_SITE_OTHER): Payer: Medicare Other | Admitting: Family Medicine

## 2016-07-30 VITALS — BP 125/52 | HR 68 | Temp 97.9°F

## 2016-07-30 DIAGNOSIS — R059 Cough, unspecified: Secondary | ICD-10-CM

## 2016-07-30 DIAGNOSIS — Z23 Encounter for immunization: Secondary | ICD-10-CM

## 2016-07-30 DIAGNOSIS — R05 Cough: Secondary | ICD-10-CM

## 2016-07-30 NOTE — Progress Notes (Addendum)
   Subjective:    Patient ID: Jamie Garcia, female    DOB: Mar 11, 1920, 82 y.o.   MRN: [id number]  HPI The patient is a 82 y.o. Caucasian female presenting today for a flu-shot and with complaints of a lingering cough. The patient notes that she has completed her course of Augmentin that was started on 07/17/2016. The patient states that her cold symptoms have resolved, however, she stills has a cough and is feeling fatigued. The patient states that her cough is productive and she is still taking Mucinex and using her albuterol inhaler twice daily. The patient denies SOB, chest pain, palpitations, or fever.  Review of Systems  Constitutional: Positive for activity change and fatigue. Negative for appetite change, chills, diaphoresis, fever and unexpected weight change.  HENT: Negative for congestion, ear discharge, ear pain, rhinorrhea, sinus pressure, sneezing and sore throat.   Eyes: Positive for discharge. Negative for photophobia, pain, redness, itching and visual disturbance.       Patient states clear eye discharge is normal for her.  Respiratory: Positive for cough. Negative for apnea, choking, chest tightness, shortness of breath and wheezing.   Cardiovascular: Negative for chest pain, palpitations and leg swelling.  Skin: Negative.   Neurological: Negative.       Objective:   Physical Exam  Constitutional: She is oriented to person, place, and time. She appears well-developed and well-nourished. No distress.  HENT:  Head: Normocephalic and atraumatic.  Right Ear: External ear normal.  Left Ear: External ear normal.  Nose: Nose normal.  Mouth/Throat: Oropharynx is clear and moist. No oropharyngeal exudate.  Eyes: Conjunctivae and EOM are normal. Pupils are equal, round, and reactive to light. Right eye exhibits discharge. Left eye exhibits discharge. No scleral icterus.  Patient with bilateral edema, erythema, and clear discharge of both eyes.  Neck: Normal range of motion. Neck  supple. No JVD present. No tracheal deviation present. No thyromegaly present.  Cardiovascular: Normal rate, regular rhythm, normal heart sounds and intact distal pulses.  Exam reveals no gallop and no friction rub.   No murmur heard. Pulmonary/Chest: Effort normal. No stridor. No respiratory distress. She has no wheezes. She has rales. She exhibits no tenderness.  Lymphadenopathy:    She has no cervical adenopathy.  Neurological: She is alert and oriented to person, place, and time. No cranial nerve deficit. Coordination normal.  Skin: Skin is warm and dry. No rash noted. She is not diaphoretic. No erythema. No pallor.  Psychiatric: She has a normal mood and affect. Her behavior is normal. Judgment and thought content normal.      Assessment & Plan:   Jamie Garcia was seen today for cough and immunizations.  Diagnoses and all orders for this visit:  Cough -     DG Chest 2 View; Future  Influenza vaccine needed -     Flu vaccine HIGH DOSE PF (Fluzone High dose)   1. Cough - Discussed with patient that her cough could be post-infectious secondary to her recent illness. Patient to get Chest x-ray today to evaluate for potential underlying pathology. Will call patient with laboratory results and determine need for further medical intervention at that time. Patient to continue with her albuterol rescue inhaler and Mucinex. Patient to do symptomatic treatment such as rest, humidifier, and over-the-counter cold medications.   Summary - Patient received flu shot in-clinic today. Will call patient with laboratory results.

## 2016-08-07 ENCOUNTER — Ambulatory Visit (INDEPENDENT_AMBULATORY_CARE_PROVIDER_SITE_OTHER): Payer: Medicare Other | Admitting: Osteopathic Medicine

## 2016-08-07 ENCOUNTER — Ambulatory Visit (INDEPENDENT_AMBULATORY_CARE_PROVIDER_SITE_OTHER): Payer: Medicare Other

## 2016-08-07 ENCOUNTER — Encounter: Payer: Self-pay | Admitting: Osteopathic Medicine

## 2016-08-07 VITALS — BP 162/67 | HR 70 | Ht <= 58 in | Wt 114.0 lb

## 2016-08-07 DIAGNOSIS — S22079A Unspecified fracture of T9-T10 vertebra, initial encounter for closed fracture: Secondary | ICD-10-CM | POA: Diagnosis not present

## 2016-08-07 DIAGNOSIS — S22089A Unspecified fracture of T11-T12 vertebra, initial encounter for closed fracture: Secondary | ICD-10-CM

## 2016-08-07 DIAGNOSIS — S22069A Unspecified fracture of T7-T8 vertebra, initial encounter for closed fracture: Secondary | ICD-10-CM

## 2016-08-07 DIAGNOSIS — M546 Pain in thoracic spine: Secondary | ICD-10-CM

## 2016-08-07 DIAGNOSIS — M542 Cervicalgia: Secondary | ICD-10-CM | POA: Diagnosis not present

## 2016-08-07 DIAGNOSIS — M4312 Spondylolisthesis, cervical region: Secondary | ICD-10-CM

## 2016-08-07 DIAGNOSIS — X58XXXA Exposure to other specified factors, initial encounter: Secondary | ICD-10-CM

## 2016-08-07 DIAGNOSIS — S22059A Unspecified fracture of T5-T6 vertebra, initial encounter for closed fracture: Secondary | ICD-10-CM | POA: Diagnosis not present

## 2016-08-07 DIAGNOSIS — M50323 Other cervical disc degeneration at C6-C7 level: Secondary | ICD-10-CM

## 2016-08-07 NOTE — Patient Instructions (Addendum)
Your Xrays show arthritis and old compression fracture but do not show any new fracture. Arthritis is likely the cause of your pain and it can affect the muscles attached to the spine.   Medications for pain  Can increase Tramadol to every 6-8 hours as needed.   Use Voltaren gel on the part that hurts  Continue Etodolac and Lyrica as usual  Other remedies  I can place referral to physical therapy if you're not doing better with home exercises  Can continue heat to the area  Avoid hunching to read, if you have a recliner try sitting slightly reclined and placing a pillow behind the head/neck for support  See attached information on neck stretching exercises  If you are still having pain in the next month, please schedule a visit with your PCP or one of our sports medicine physicians. If you get worse or if symptoms change, please tell us right away!      Spinal Compression Fracture A spinal compression fracture is a collapse of the bones that form the spine (vertebrae). With this type of fracture, the vertebrae become squashed (compressed) into a wedge shape. Most compression fractures happen in the middle or lower part of the spine. CAUSES This condition may be caused by: Thinning and loss of density in the bones (osteoporosis). This is the most common cause. A fall. A car or motorcycle accident. Cancer. Trauma, such as a heavy, direct hit to the head. RISK FACTORS You may be at greater risk for a spinal compression fracture if you: Are 29 years old or older. Have osteoporosis. Have certain types of cancer, including: Multiple myeloma. Lymphoma. Prostate cancer. Lung cancer. Breast cancer. SYMPTOMS Symptoms of this condition include: Severe pain. Pain that gets worse over time. Pain that is worse when you stand, walk, sit, or bend. Sudden pain that is so bad that it is hard for you to move. Bending or humping of the spine. Gradual loss of height. Numbness,  tingling, or weakness in the back and legs. Trouble walking. Your symptoms will depend on the cause of the fracture and how quickly it develops. For example, fractures that are caused by osteoporosis can cause few symptoms, no symptoms, or symptoms that develop slowly over time. DIAGNOSIS This condition may be diagnosed based on symptoms, medical history, and a physical exam. During the physical exam, your health care provider may tap along the length of your spine to check for tenderness. Tests may be done to confirm the diagnosis. They may include: A bone density test to check for osteoporosis. Imaging tests, such as a spine X-ray, a CT scan, or MRI. TREATMENT Treatment for this condition depends on the cause and severity of the condition.Some fractures, such as those that are caused by osteoporosis, may heal on their own with supportive care. This may include: Pain medicine. Rest. A back brace. Physical therapy exercises. Medicine that reduces bone pain. Calcium and vitamin D supplements. Fractures that cause the back to become misshapen, cause nerve pain or weakness, or do not respond to other treatment may be treated with a surgical procedure, such as: Vertebroplasty. In this procedure, bone cement is injected into the collapsed vertebrae to stabilize them. Balloon kyphoplasty. In this procedure, the collapsed vertebrae are expanded with a balloon and then bone cement is injected into them. Spinal fusion. In this procedure, the collapsed vertebrae are connected (fused) to normal vertebrae. HOME CARE INSTRUCTIONS General Instructions Take medicines only as directed by your health care provider. Do not  drive or operate heavy machinery while taking pain medicine. If directed, apply ice to the injured area: Put ice in a plastic bag. Place a towel between your skin and the bag. Leave the ice on for 30 minutes every two hours at first. Then apply the ice as needed. Wear your neck brace or  back brace as directed by your health care provider. Do not drink alcohol. Alcohol can interfere with your treatment. Keep all follow-up visits as directed by your health care provider. This is important. It can help to prevent permanent injury, disability, and long-lasting (chronic) pain. Activity Stay in bed (on bed rest) only as directed by your health care provider. Being on bed rest for too long can make your condition worse. Return to your normal activities as directed by your health care provider. Ask what activities are safe for you. Do exercises to improve motion and strength in your back (physical therapy), as recommended by your health care provider. Exercise regularly as directed by your health care provider. SEEK MEDICAL CARE IF: You have a fever. You develop a cough that makes your pain worse. Your pain medicine is not helping. Your pain does not get better over time. You cannot return to your normal activities as planned or expected. SEEK IMMEDIATE MEDICAL CARE IF: Your pain is very bad and it suddenly gets worse. You are unable to move any body part (paralysis) that is below the level of your injury. You have numbness, tingling, or weakness in any body part that is below the level of your injury. You cannot control your bladder or bowels.   This information is not intended to replace advice given to you by your health care provider. Make sure you discuss any questions you have with your health care provider.   Document Released: 10/19/2005 Document Revised: 03/05/2015 Document Reviewed: 10/23/2014 Elsevier Interactive Patient Education [employer].

## 2016-08-07 NOTE — Progress Notes (Signed)
HPI: Jamie Garcia is a 82 y.o. female  who [facility] today, 08/07/16,  for chief complaint of:  Chief Complaint  Patient presents with  . Shoulder Pain     RIGHT SHOULDER PAIN RADIATES UP INTO THE NECK AREA     . Context: no injury, neck pain is new past day or so and she is nervous it might be related to history of seizure . Location: R shoulder-blade/thoracic area radiating into the neck/occiput . Quality: pain/soreness . Duration: worse over 2 weeks but present many months, neck pain newer . Timing: constant  . Modifying factors: heating pad helping a little but not much. Medications as below - etodolac po, tramadol, lyrica as usual. Has not used the Voltaren gel . Assoc signs/symptoms: no numbness, no fall. Worried about "lump on back of my head"   Patient is accompanied by son who assists with history-taking.   Past medical, surgical, social and family history reviewed: Past Medical History:  Diagnosis Date  . Chronic back pain   . GERD (gastroesophageal reflux disease)   . Hypertension   . RLS (restless legs syndrome)   . Seasonal allergies    Past Surgical History:  Procedure Laterality Date  . BACK SURGERY    . CATARACT EXTRACTION    . HIP SURGERY     Social History  Substance Use Topics  . Smoking status: Never Smoker  . Smokeless tobacco: Not on file  . Alcohol use No   No family history on file.   Current medication list and allergy/intolerance information reviewed:   Current Outpatient Prescriptions  Medication Sig Dispense Refill  . AMBULATORY NON FORMULARY MEDICATION Space for rescue inhaler 1 Device 0  . aspirin 81 MG chewable tablet Chew by mouth daily.    . Biotin 1 MG CAPS Take 5,000 mcg by mouth.     . Calcium-Phosphorus-Vitamin D (CITRACAL +D3 PO) Take by mouth.    . Carboxymethylcellul-Glycerin (REFRESH OPTIVE OP) Apply to eye.    . diclofenac sodium (VOLTAREN) 1 % GEL Apply 4 g topically 4 (four)  times daily. 4 Tube 1  . etodolac (LODINE) 400 MG tablet Take 1 tablet (400 mg total) by mouth 2 (two) times daily. 180 tablet 1  . ferrous fumarate (HEMOCYTE - 106 MG FE) 325 (106 FE) MG TABS tablet Take 1 tablet by mouth.    . fexofenadine (ALLEGRA) 180 MG tablet Take 1 tablet (180 mg total) by mouth daily. 90 tablet 1  . fluticasone (FLONASE) 50 MCG/ACT nasal spray Place 2 sprays into both nostrils daily. 16 g 11  . Fluticasone-Salmeterol (ADVAIR DISKUS) 250-50 MCG/DOSE AEPB Inhale 1 puff into the lungs 2 (two) times daily. 180 each 1  . hydrochlorothiazide (HYDRODIURIL) 12.5 MG tablet Take 1 tablet (12.5 mg total) by mouth daily. 90 tablet 1  . levETIRAcetam (KEPPRA) 250 MG tablet Take 1 tablet (250 mg total) by mouth 2 (two) times daily. 180 tablet 1  . levothyroxine (SYNTHROID, LEVOTHROID) 50 MCG tablet Take 1 tablet (50 mcg total) by mouth daily. 90 tablet 0  . magnesium 30 MG tablet Take 500 mg by mouth 2 (two) times daily.    . montelukast (SINGULAIR) 10 MG tablet Take 1 tablet (10 mg total) by mouth at bedtime. 90 tablet 1  . oxybutynin (DITROPAN) 5 MG tablet Take 1 tablet (5 mg total) by mouth daily. 90 tablet 1  . pramipexole (MIRAPEX) 0.25 MG tablet Take 1 tablet (0.25 mg total) by mouth  at bedtime. 90 tablet 1  . pregabalin (LYRICA) 150 MG capsule Take 1 capsule (150 mg total) by mouth 2 (two) times daily. 180 capsule 1  . primidone (MYSOLINE) 50 MG tablet Take 1 tablet (50 mg total) by mouth 3 (three) times daily. 270 tablet 1  . propranolol ER (INDERAL LA) 60 MG 24 hr capsule Take 1 capsule (60 mg total) by mouth daily. 90 capsule 0  . RABEprazole (ACIPHEX) 20 MG tablet Take 1 tablet (20 mg total) by mouth 2 (two) times daily. 180 tablet 1  . simvastatin (ZOCOR) 20 MG tablet Take 1 tablet (20 mg total) by mouth daily. 90 tablet 1  . traMADol (ULTRAM) 50 MG tablet Take 1 tablet (50 mg total) by mouth every 8 (eight) hours as needed. 60 tablet 0  . vitamin E 100 UNIT capsule Take by  mouth daily.    . Zinc 50 MG CAPS Take by mouth.     No current facility-administered medications for this visit.    Allergies  Allergen Reactions  . Motrin [Ibuprofen]     Itching hands      Review of Systems:  Constitutional:  No  fever, no chills, No recent illness  HEENT: No  headache, no vision change  Cardiac: No  chest pain, No  pressure  Respiratory:  No  shortness of breath.  Gastrointestinal: No  abdominal pain, No  nausea  Musculoskeletal: +new myalgia/arthralgia  Skin: No  Rash  Neurologic: No  dizziness, No  slurred speech/focal weakness/facial droop  Exam:  BP (!) 162/67   Pulse 70   Ht 4\' 8"  (1.422 m)   Wt 114 lb (51.7 kg)   BMI 25.56 kg/m   Constitutional: VS see above. General Appearance: alert, well-developed, well-nourished, NAD  Eyes: Normal lids and conjunctive, non-icteric sclera  Ears, Nose, Mouth, Throat: MMM, Normal external inspection ears/nares/mouth/lips/gums. TM normal bilaterally.   Neck: No masses, trachea midline. No thyroid enlargement. No tenderness/mass appreciated. No lymphadenopathy  Respiratory: Normal respiratory effort. no wheeze, no rhonchi.   Cardiovascular: S1/S2 normal, no murmur, no rub/gallop auscultated. RRR.   Musculoskeletal: no spinal midline tenderness. +tenderness at R occipital base   Neurological: No cranial nerve deficit on limited exam. Motor and sensation intact and symmetric. Normal coordination. EOMI, pupil exam limited due to previous surgeries.   Skin: warm, dry, intact. No rash/ulcer.   Psychiatric: Normal judgment/insight. Normal mood and affect. Oriented x3.    Dg Cervical Spine 2 Or 3 Views  Result Date: 08/07/2016 CLINICAL DATA:  Sharp neck pain and upper back pain since yesterday, increasing neck pain today EXAM: CERVICAL SPINE - 2-3 VIEW COMPARISON:  None FINDINGS: Osseous demineralization. Vertebral body heights maintained. Disc space narrowing with endplate spur formation at C4-C5  through C6-C7. Anterolisthesis C3-C4 approximately 2.8 mm. Multilevel facet degenerative changes. No acute fracture, additional subluxation, or bone destruction. IMPRESSION: Degenerative disc and facet disease changes of the cervical spine as above. Mild anterolisthesis C3-C4 likely due to degenerative facet disease. Electronically Signed   By: Ulyses Southward M.D.   On: 08/07/2016 09:09   Dg Thoracic Spine 2 View  Result Date: 08/07/2016 CLINICAL DATA:  Sharp neck pain and upper back pain since yesterday EXAM: THORACIC SPINE 2 VIEWS COMPARISON:  Chest radiographs 07/30/2016 FINDINGS: 12 pairs of ribs. Marked osseous demineralization. Compression fractures of multiple thoracic vertebra, question T7, T9, T11 and T12, less at T5 and T10, unchanged. Superior endplate compression fracture L2 unchanged. No definite new fracture identified. IMPRESSION: Multiple thoracolumbar compression  fractures unchanged since 07/30/2016. New line osseous demineralization. Electronically Signed   By: Ulyses Southward M.D.   On: 08/07/2016 09:15      ASSESSMENT/PLAN:   Neck pain - Plan: DG Cervical Spine 2 or 3 views  Right-sided thoracic back pain, unspecified chronicity - Plan: DG Thoracic Spine 2 View    Patient Instructions  Your Xrays show arthritis and old compression fracture but do not show any new fracture. Arthritis is likely the cause of your pain and it can affect the muscles attached to the spine.   Medications for pain  Can increase Tramadol to every 6-8 hours as needed.   Use Voltaren gel on the part that hurts  Continue Etodolac and Lyrica as usual  Other remedies  I can place referral to physical therapy if you're not doing better with home exercises  Can continue heat to the area  Avoid hunching to read, if you have a recliner try sitting slightly reclined and placing a pillow behind the head/neck for support  See attached information on neck stretching exercises  If you are still having pain  in the next month, please schedule a visit with your PCP or one of our sports medicine physicians. If you get worse or if symptoms change, please tell us right away!      Patient information printed for her neck stretching exercises and for appropriate calcium intake.   Visit summary with medication list and pertinent instructions was printed for patient to review. All questions at time of visit were answered - patient instructed to contact office with any additional concerns. ER/RTC precautions were reviewed with the patient. Follow-up plan: Return if symptoms worsen or fail to improve.  Note: Total time spent 25 minutes, greater than 50% of the visit was spent face-to-face counseling and coordinating care for the following: The primary encounter diagnosis was Neck pain. A diagnosis of Right-sided thoracic back pain, unspecified chronicity was also pertinent to this visit.Marland Kitchen

## 2016-08-18 ENCOUNTER — Encounter: Payer: Self-pay | Admitting: *Deleted

## 2016-08-18 ENCOUNTER — Emergency Department (INDEPENDENT_AMBULATORY_CARE_PROVIDER_SITE_OTHER)
Admission: EM | Admit: 2016-08-18 | Discharge: 2016-08-18 | Disposition: A | Discharge status: Home / Self Care | Payer: Medicare Other | Source: Home / Self Care | Attending: Family Medicine | Admitting: Family Medicine

## 2016-08-18 ENCOUNTER — Emergency Department (INDEPENDENT_AMBULATORY_CARE_PROVIDER_SITE_OTHER): Payer: Medicare Other

## 2016-08-18 DIAGNOSIS — M25511 Pain in right shoulder: Secondary | ICD-10-CM

## 2016-08-18 DIAGNOSIS — M25562 Pain in left knee: Secondary | ICD-10-CM

## 2016-08-18 DIAGNOSIS — S8002XA Contusion of left knee, initial encounter: Secondary | ICD-10-CM

## 2016-08-18 DIAGNOSIS — W010XXA Fall on same level from slipping, tripping and stumbling without subsequent striking against object, initial encounter: Secondary | ICD-10-CM

## 2016-08-18 NOTE — ED Provider Notes (Signed)
CSN: [id number]     Arrival date & time 08/18/16  1057 History   First MD Initiated Contact with Patient 08/18/16 1115     Chief Complaint  Patient presents with  . Knee Pain  . Shoulder Injury   (Consider location/radiation/quality/duration/timing/severity/associated sxs/prior Treatment) HPI  Laurielle Selmon is a 82 y.o. female presenting to UC with her son c/o Right shoulder and Left knee pain after trip and fall 3 days ago.  Pain is aching and sore, worse with movement, 8/10 at worst.  She does have tramadol at home, which she took around 8:30AM this morning with mild to moderate relief.  Pt's son notes she has a hx of chronic neck and Right shoulder pain due to arthritis.  Pt has bilateral artificial knees, surgery was almost 10 years ago in [place].  She does not currently have an orthopedist.  Denies other injuries.  Pt lives with her son.    Past Medical History:  Diagnosis Date  . Chronic back pain   . GERD (gastroesophageal reflux disease)   . Hypertension   . RLS (restless legs syndrome)   . Seasonal allergies    Past Surgical History:  Procedure Laterality Date  . BACK SURGERY    . CATARACT EXTRACTION    . HIP SURGERY     History reviewed. No pertinent family history. Social History  Substance Use Topics  . Smoking status: Never Smoker  . Smokeless tobacco: Never Used  . Alcohol use No   OB History    No data available     Review of Systems  Musculoskeletal: Positive for arthralgias, gait problem ( chronic, walks with a cane) and myalgias. Negative for back pain and joint swelling.  Skin: Positive for color change ( bruising Left leg). Negative for wound.  Neurological: Negative for weakness and numbness.    Allergies  Motrin [ibuprofen]  Home Medications   Prior to Admission medications   Medication Sig Start Date End Date Taking? Authorizing Provider  AMBULATORY NON FORMULARY MEDICATION Space for rescue inhaler 07/17/16   Jomarie Longs, PA-C  aspirin 81 MG  chewable tablet Chew by mouth daily.    Historical Provider, MD  Biotin 1 MG CAPS Take 5,000 mcg by mouth.     Historical Provider, MD  Calcium-Phosphorus-Vitamin D (CITRACAL +D3 PO) Take by mouth.    Historical Provider, MD  Carboxymethylcellul-Glycerin (REFRESH OPTIVE OP) Apply to eye.    Historical Provider, MD  diclofenac sodium (VOLTAREN) 1 % GEL Apply 4 g topically 4 (four) times daily. 07/17/16   Jade L Breeback, PA-C  etodolac (LODINE) 400 MG tablet Take 1 tablet (400 mg total) by mouth 2 (two) times daily. 03/19/16   Jade L Breeback, PA-C  ferrous fumarate (HEMOCYTE - 106 MG FE) 325 (106 FE) MG TABS tablet Take 1 tablet by mouth.    Historical Provider, MD  fexofenadine (ALLEGRA) 180 MG tablet Take 1 tablet (180 mg total) by mouth daily. 03/19/16   Jade L Breeback, PA-C  fluticasone (FLONASE) 50 MCG/ACT nasal spray Place 2 sprays into both nostrils daily. 03/19/16   Jade L Breeback, PA-C  Fluticasone-Salmeterol (ADVAIR DISKUS) 250-50 MCG/DOSE AEPB Inhale 1 puff into the lungs 2 (two) times daily. 03/19/16   Jade L Breeback, PA-C  hydrochlorothiazide (HYDRODIURIL) 12.5 MG tablet Take 1 tablet (12.5 mg total) by mouth daily. 03/19/16   Jade L Breeback, PA-C  levETIRAcetam (KEPPRA) 250 MG tablet Take 1 tablet (250 mg total) by mouth 2 (two) times daily. 03/19/16  Jade L Breeback, PA-C  levothyroxine (SYNTHROID, LEVOTHROID) 50 MCG tablet Take 1 tablet (50 mcg total) by mouth daily. 07/20/16   Jade L Breeback, PA-C  magnesium 30 MG tablet Take 500 mg by mouth 2 (two) times daily.    Historical Provider, MD  montelukast (SINGULAIR) 10 MG tablet Take 1 tablet (10 mg total) by mouth at bedtime. 03/19/16   Jade L Breeback, PA-C  oxybutynin (DITROPAN) 5 MG tablet Take 1 tablet (5 mg total) by mouth daily. 03/19/16   Jade L Breeback, PA-C  pramipexole (MIRAPEX) 0.25 MG tablet Take 1 tablet (0.25 mg total) by mouth at bedtime. 03/19/16   Jade L Breeback, PA-C  pregabalin (LYRICA) 150 MG capsule Take 1 capsule  (150 mg total) by mouth 2 (two) times daily. 03/19/16   Jade L Breeback, PA-C  primidone (MYSOLINE) 50 MG tablet Take 1 tablet (50 mg total) by mouth 3 (three) times daily. 03/19/16   Jade L Breeback, PA-C  propranolol ER (INDERAL LA) 60 MG 24 hr capsule Take 1 capsule (60 mg total) by mouth daily. 05/04/16   Jade L Breeback, PA-C  RABEprazole (ACIPHEX) 20 MG tablet Take 1 tablet (20 mg total) by mouth 2 (two) times daily. 03/19/16   Jade L Breeback, PA-C  simvastatin (ZOCOR) 20 MG tablet Take 1 tablet (20 mg total) by mouth daily. 03/19/16   Jade L Breeback, PA-C  traMADol (ULTRAM) 50 MG tablet Take 1 tablet (50 mg total) by mouth every 8 (eight) hours as needed. 07/17/16   Jade L Breeback, PA-C  vitamin E 100 UNIT capsule Take by mouth daily.    Historical Provider, MD  Zinc 50 MG CAPS Take by mouth.    Historical Provider, MD   Meds Ordered and Administered this Visit  Medications - No data to display  BP 144/77 (BP Location: Left Arm)   Pulse 73   Wt 113 lb (51.3 kg)   SpO2 95%   BMI 25.33 kg/m  No data found.   Physical Exam  Constitutional: She is oriented to person, place, and time. She appears well-developed and well-nourished.  HENT:  Head: Normocephalic and atraumatic.  Eyes: EOM are normal.  Neck: Normal range of motion.  Cardiovascular: Normal rate.   Pulmonary/Chest: Effort normal.  Musculoskeletal: Normal range of motion. She exhibits tenderness. She exhibits no edema.  Right shoulder: no deformity. Full ROM, tenderness to posterior aspect over upper trapezius.  No midline spinal tenderness. Left knee: well healed surgical scar. Tenderness over anterior and medial aspect of knee.  Calf is soft, non-tender.   Neurological: She is alert and oriented to person, place, and time.  Skin: Skin is warm and dry.  Left knee: skin in tact, ecchymosis inferior to knee.   Psychiatric: She has a normal mood and affect. Her behavior is normal.  Nursing note and vitals  reviewed.   Urgent Care Course   Clinical Course    Procedures (including critical care time)  Labs Review Labs Reviewed - No data to display  Imaging Review Dg Shoulder Right  Result Date: 08/18/2016 CLINICAL DATA:  Right shoulder pain following fall 4 days ago, initial encounter EXAM: RIGHT SHOULDER - 2+ VIEW COMPARISON:  None. FINDINGS: There is no evidence of fracture or dislocation. There is no evidence of arthropathy or other focal bone abnormality. Soft tissues are unremarkable. IMPRESSION: No acute abnormality noted. Electronically Signed   By: Alcide Clever M.D.   On: 08/18/2016 12:03   Dg Knee Complete 4 Views Left  Result Date: 08/18/2016 CLINICAL DATA:  Fall 4 days ago with left knee pain, initial encounter EXAM: LEFT KNEE - COMPLETE 4+ VIEW COMPARISON:  None. FINDINGS: Left knee prosthesis is seen in satisfactory position. No acute fracture or dislocation is noted. No joint effusion is seen. Diffuse vascular calcifications are noted. IMPRESSION: No acute abnormality noted. Electronically Signed   By: Alcide CleverMark  Lukens M.D.   On: 08/18/2016 12:04      MDM   1. Fall from slip, trip, or stumble, initial encounter   2. Acute pain of right shoulder   3. Acute pain of left knee   4. Contusion of left knee, initial encounter    Pt c/o exacerbation of chronic Right shoulder pain and Left knee pain after trip and fall 3 days ago. Denies other injuries.   Plain films: Negative for acute findings. Encouraged rest, ice, compression (knee sleeve applied to Left knee), and elevation to help with bruising on Left leg. May have tramadol and acetaminophen as needed for pain. Encouraged f/u with PCP or Sports Medicine as needed. Pt and son verbalized understanding and agreement with tx plan.     Junius FinnerErin O'Malley, PA-C 08/18/16 1329

## 2016-08-18 NOTE — ED Triage Notes (Signed)
Pt reports missing one step and home and falling forward. C/o left anterior knee pain and right scapular pain. H/o artifcial knee and arthritis in her shoulder. Taking tylenol and tramadol @ home, using heating pad

## 2016-08-21 ENCOUNTER — Telehealth: Payer: Self-pay | Admitting: *Deleted

## 2016-08-21 NOTE — Telephone Encounter (Signed)
Spoke to pt she reports that she is feeling much better.

## 2016-08-26 ENCOUNTER — Other Ambulatory Visit: Payer: Self-pay | Admitting: Physician Assistant

## 2016-08-27 ENCOUNTER — Other Ambulatory Visit: Payer: Self-pay | Admitting: *Deleted

## 2016-08-27 MED ORDER — PREGABALIN 150 MG PO CAPS: 150.0000 mg | ORAL_CAPSULE | Freq: Two times a day (BID) | ORAL | 1 refills | Status: AC

## 2016-09-03 ENCOUNTER — Other Ambulatory Visit: Payer: Self-pay | Admitting: Physician Assistant

## 2016-10-01 ENCOUNTER — Emergency Department (INDEPENDENT_AMBULATORY_CARE_PROVIDER_SITE_OTHER): Payer: Medicare Other

## 2016-10-01 ENCOUNTER — Encounter: Payer: Self-pay | Admitting: *Deleted

## 2016-10-01 ENCOUNTER — Other Ambulatory Visit: Payer: Self-pay | Admitting: Physician Assistant

## 2016-10-01 ENCOUNTER — Emergency Department (INDEPENDENT_AMBULATORY_CARE_PROVIDER_SITE_OTHER)
Admission: EM | Admit: 2016-10-01 | Discharge: 2016-10-01 | Disposition: A | Discharge status: Home / Self Care | Payer: Medicare Other | Source: Home / Self Care | Attending: Family Medicine | Admitting: Family Medicine

## 2016-10-01 DIAGNOSIS — M40204 Unspecified kyphosis, thoracic region: Secondary | ICD-10-CM | POA: Diagnosis not present

## 2016-10-01 DIAGNOSIS — H6123 Impacted cerumen, bilateral: Secondary | ICD-10-CM

## 2016-10-01 DIAGNOSIS — B9789 Other viral agents as the cause of diseases classified elsewhere: Secondary | ICD-10-CM

## 2016-10-01 DIAGNOSIS — J069 Acute upper respiratory infection, unspecified: Secondary | ICD-10-CM | POA: Diagnosis not present

## 2016-10-01 DIAGNOSIS — J4531 Mild persistent asthma with (acute) exacerbation: Secondary | ICD-10-CM

## 2016-10-01 LAB — POCT CBC W AUTO DIFF (K'VILLE URGENT CARE)

## 2016-10-01 MED ORDER — CEFDINIR 300 MG PO CAPS: 300.0000 mg | ORAL_CAPSULE | Freq: Two times a day (BID) | ORAL | 0 refills | Status: DC

## 2016-10-01 MED ORDER — PREDNISONE 20 MG PO TABS: ORAL_TABLET | ORAL | 0 refills | Status: DC

## 2016-10-01 MED ORDER — IPRATROPIUM-ALBUTEROL 0.5-2.5 (3) MG/3ML IN SOLN
3.0000 mL | Freq: Four times a day (QID) | RESPIRATORY_TRACT | Status: DC
  Administered 2016-10-01: 3 mL via RESPIRATORY_TRACT

## 2016-10-01 MED ORDER — IPRATROPIUM-ALBUTEROL 0.5-2.5 (3) MG/3ML IN SOLN
3.0000 mL | Freq: Once | RESPIRATORY_TRACT | Status: AC
  Administered 2016-10-01: 3 mL via RESPIRATORY_TRACT

## 2016-10-01 MED ORDER — METHYLPREDNISOLONE SODIUM SUCC 125 MG IJ SOLR
80.0000 mg | Freq: Once | INTRAMUSCULAR | Status: AC
  Administered 2016-10-01: 80 mg via INTRAMUSCULAR

## 2016-10-01 MED ORDER — ACETAMINOPHEN 325 MG PO TABS
650.0000 mg | ORAL_TABLET | Freq: Once | ORAL | Status: AC
  Administered 2016-10-01: 650 mg via ORAL

## 2016-10-01 MED ORDER — BUDESONIDE-FORMOTEROL FUMARATE 80-4.5 MCG/ACT IN AERO: INHALATION_SPRAY | RESPIRATORY_TRACT | 0 refills | Status: DC

## 2016-10-01 NOTE — ED Triage Notes (Signed)
Pt c/o 2 months of cough, productive at times, sinus pressure and fullness, ear fullness, nasal congestion. Afebrile. SOB with minimal exertion. Taken Flonase, Mucinex and Astelin.

## 2016-10-01 NOTE — Discharge Instructions (Signed)
Begin prednisone on Friday October 02, 2016. Take plain guaifenesin (600mg  extended release tabs such as Mucinex) twice daily, with plenty of water, for cough and congestion.  Get adequate rest.   May use Afrin nasal spray (or generic oxymetazoline) twice daily for about 5 days and then discontinue.  Also recommend using saline nasal spray several times daily and saline nasal irrigation (AYR is a common brand).  Use Flonase nasal spray each morning after using Afrin nasal spray and saline nasal irrigation. Continue albuterol inhaler as prescribed. Try warm salt water gargles for sore throat.  Stop all antihistamines for now, and other non-prescription cough/cold preparations. Begin Omnicef if not improving about 5 days or if persistent fever develops   Follow-up with family doctor if not improving about one week.

## 2016-10-01 NOTE — ED Provider Notes (Signed)
[facility]    CSN: [id number] Arrival date & time: 10/01/16  1340     History   Chief Complaint Chief Complaint  Patient presents with  . Cough  . Sinus Problem  . Ear Fullness    HPI Jamie Garcia is a 82 y.o. female.   Patient complains of persist cough and chest congestion for about two months, sinus congestion, and ear fullness.  She has been worse during the past several days.  She has shortness of breath with minimal exertion.  No fevers, chills, and sweats.  She has an Advair discus, but states that she is unable to inhale the dry powder, and prefers a pressurized spray device.   The history is provided by the patient and a relative.    Past Medical History:  Diagnosis Date  . Chronic back pain   . GERD (gastroesophageal reflux disease)   . Hypertension   . RLS (restless legs syndrome)   . Seasonal allergies     Patient Active Problem List   Diagnosis Date Noted  . CKD (chronic kidney disease), stage III 07/20/2016  . Chronic pain of left thumb 07/20/2016  . Primary osteoarthritis of left hip 07/20/2016  . Lumbar radiculopathy 03/19/2016  . Asthma, mild intermittent 03/19/2016  . Gastroesophageal reflux disease without esophagitis 03/19/2016  . RLS (restless legs syndrome) 03/19/2016  . Essential hypertension, benign 03/19/2016  . Seasonal allergies 03/19/2016  . OAB (overactive bladder) 03/19/2016  . Hyperlipidemia 03/19/2016  . Seizures (HCC) 03/19/2016  . Post-nasal drip 03/19/2016  . Thyroid activity decreased 03/19/2016    Past Surgical History:  Procedure Laterality Date  . BACK SURGERY    . CATARACT EXTRACTION    . HIP SURGERY      OB History    No data available       Home Medications    Prior to Admission medications   Medication Sig Start Date End Date Taking? Authorizing Provider  AMBULATORY NON FORMULARY MEDICATION Space for rescue inhaler 07/17/16   Jomarie Longs, PA-C  aspirin 81 MG chewable tablet Chew by mouth  daily.    Historical Provider, MD  Biotin 1 MG CAPS Take 5,000 mcg by mouth.     Historical Provider, MD  budesonide-formoterol (SYMBICORT) 80-4.5 MCG/ACT inhaler Inhale 1 puff BID 10/01/16   Lattie Haw, MD  Calcium-Phosphorus-Vitamin D (CITRACAL +D3 PO) Take by mouth.    Historical Provider, MD  Carboxymethylcellul-Glycerin (REFRESH OPTIVE OP) Apply to eye.    Historical Provider, MD  cefdinir (OMNICEF) 300 MG capsule Take 1 capsule (300 mg total) by mouth 2 (two) times daily. (Rx void after 10/10/16) 10/01/16   Lattie Haw, MD  diclofenac sodium (VOLTAREN) 1 % GEL Apply 4 g topically 4 (four) times daily. 07/17/16   Jade L Breeback, PA-C  etodolac (LODINE) 400 MG tablet Take 1 tablet (400 mg total) by mouth 2 (two) times daily. 03/19/16   Jade L Breeback, PA-C  ferrous fumarate (HEMOCYTE - 106 MG FE) 325 (106 FE) MG TABS tablet Take 1 tablet by mouth.    Historical Provider, MD  fexofenadine (ALLEGRA) 180 MG tablet TAKE 1 TABLET DAILY ( FEXOFENADINE IS GENERIC FOR ALLEGRA ) 09/03/16   Jade L Breeback, PA-C  fluticasone (FLONASE) 50 MCG/ACT nasal spray Place 2 sprays into both nostrils daily. 03/19/16   Jade L Breeback, PA-C  Fluticasone-Salmeterol (ADVAIR DISKUS) 250-50 MCG/DOSE AEPB Inhale 1 puff into the lungs 2 (two) times daily. 03/19/16   Jomarie Longs, PA-C  hydrochlorothiazide (HYDRODIURIL) 12.5 MG tablet Take 1 tablet (12.5 mg total) by mouth daily. 03/19/16   Jade L Breeback, PA-C  levETIRAcetam (KEPPRA) 250 MG tablet Take 1 tablet (250 mg total) by mouth 2 (two) times daily. 03/19/16   Jade L Breeback, PA-C  levothyroxine (SYNTHROID, LEVOTHROID) 50 MCG tablet TAKE 1 TABLET DAILY 10/01/16   Jade L Breeback, PA-C  magnesium 30 MG tablet Take 500 mg by mouth 2 (two) times daily.    Historical Provider, MD  montelukast (SINGULAIR) 10 MG tablet TAKE 1 TABLET AT BEDTIME 09/03/16   Jade L Breeback, PA-C  oxybutynin (DITROPAN) 5 MG tablet Take 1 tablet (5 mg total) by mouth daily. 03/19/16    Jade L Breeback, PA-C  pramipexole (MIRAPEX) 0.25 MG tablet TAKE 1 TABLET AT BEDTIME 09/03/16   Jade L Breeback, PA-C  predniSONE (DELTASONE) 20 MG tablet Take one tab by mouth twice daily for 3 days, then one daily for 3 days. Take with food. 10/01/16   Lattie HawStephen A Beese, MD  pregabalin (LYRICA) 150 MG capsule Take 1 capsule (150 mg total) by mouth 2 (two) times daily. 08/27/16   Sunnie NielsenNatalie Alexander, DO  primidone (MYSOLINE) 50 MG tablet TAKE 1 TABLET THREE TIMES A DAY 09/03/16   Jade L Breeback, PA-C  PROAIR HFA 108 (90 Base) MCG/ACT inhaler USE 2 INHALATIONS EVERY 6 HOURS AS NEEDED FOR WHEEZING OR SHORTNESS OF BREATH 08/27/16   Jade L Breeback, PA-C  propranolol ER (INDERAL LA) 60 MG 24 hr capsule Take 1 capsule (60 mg total) by mouth daily. 05/04/16   Jade L Breeback, PA-C  RABEprazole (ACIPHEX) 20 MG tablet Take 1 tablet (20 mg total) by mouth 2 (two) times daily. 03/19/16   Jade L Breeback, PA-C  simvastatin (ZOCOR) 20 MG tablet Take 1 tablet (20 mg total) by mouth daily. 03/19/16   Jade L Breeback, PA-C  traMADol (ULTRAM) 50 MG tablet Take 1 tablet (50 mg total) by mouth every 8 (eight) hours as needed. 07/17/16   Jade L Breeback, PA-C  vitamin E 100 UNIT capsule Take by mouth daily.    Historical Provider, MD  Zinc 50 MG CAPS Take by mouth.    Historical Provider, MD    Family History History reviewed. No pertinent family history.  Social History Social History  Substance Use Topics  . Smoking status: Never Smoker  . Smokeless tobacco: Never Used  . Alcohol use No     Allergies   Motrin [ibuprofen]   Review of Systems Review of Systems No sore throat + cough No pleuritic pain + wheezing + nasal congestion + post-nasal drainage No sinus pain/pressure No itchy/red eyes No earache, but has ear fullness No hemoptysis + SOB with activity No fever/chills No nausea No vomiting No abdominal pain No diarrhea No urinary symptoms No skin rash + fatigue No myalgias No headache      Physical Exam Triage Vital Signs ED Triage Vitals  Enc Vitals Group     BP 10/01/16 1422 138/73     Pulse Rate 10/01/16 1422 76     Resp 10/01/16 1422 14     Temp 10/01/16 1422 97.4 F (36.3 C)     Temp Source 10/01/16 1422 Oral     SpO2 10/01/16 1422 (!) 86 %     Weight 10/01/16 1422 112 lb (50.8 kg)     Height --      Head Circumference --      Peak Flow --      Pain  Score 10/01/16 1423 7     Pain Loc --      Pain Edu? --      Excl. in GC? --    No data found.   Updated Vital Signs BP 138/73 (BP Location: Left Arm)   Pulse 76   Temp 97.4 F (36.3 C) (Oral)   Resp 14   Wt 112 lb (50.8 kg)   SpO2 92% Comment: AFTER APPLICATION OF 1L [place]   BMI 25.11 kg/m   Visual Acuity Right Eye Distance:   Left Eye Distance:   Bilateral Distance:    Right Eye Near:   Left Eye Near:    Bilateral Near:     Physical Exam Nursing notes and Vital Signs reviewed. Appearance:  Patient appears stated age, and in no acute distress Eyes:  Pupils are equal, round, and reactive to light and accomodation.  Extraocular movement is intact.  Conjunctivae are not inflamed  Ears:   Canals are occluded with cerumen bilaterally.  Post lavage, tympanic membranes normal.  Nose:  Mildly congested turbinates.  No sinus tenderness.   Pharynx:  Normal Neck:  Supple.  Tender enlarged posterior/lateral nodes are palpated bilaterally  Lungs:   Scattered expiratory wheezes.  Post neb treatment, decrease in wheezing.  Breath sounds are equal.  Moving air well. Heart:  Regular rate and rhythm without murmurs, rubs, or gallops.  Abdomen:  Nontender without masses or hepatosplenomegaly.  Bowel sounds are present.  No CVA or flank tenderness.  Extremities:  No edema.  Skin:  No rash present.    UC Treatments / Results  Labs (all labs ordered are listed, but only abnormal results are displayed) Labs Reviewed  POCT CBC W AUTO DIFF ([facility]):  WBC 8.7; LY 32.2; MO 12.3; GR 55.5; Hgb 15.2;  Platelets 276     EKG  EKG Interpretation None       Radiology Dg Chest 2 View  Result Date: 10/01/2016 CLINICAL DATA:  82 year old female with increased cough and shortness of breath for 2 months. Initial encounter. EXAM: CHEST  2 VIEW COMPARISON:  08/07/2016 and earlier. FINDINGS: Exaggerated thoracic kyphosis with increased AP dimension to the chest. Stable lung volumes. Stable cardiac size and mediastinal contours. Cardiac size at the upper limits of normal to mildly increased. No pneumothorax or pulmonary edema. No pleural effusion or consolidation. Stable mild increased pulmonary interstitial markings since June. Extensive thoracic compression fractures. Stable visualized osseous structures. IMPRESSION: No acute cardiopulmonary abnormality. Chronic pulmonary interstitial changes with increased AP dimension to the chest in part due to exaggerated thoracic kyphosis. Electronically Signed   By: Odessa FlemingH  Hall M.D.   On: 10/01/2016 16:04    Procedures Procedures  Bilateral ear lavage (by nurse)   Medications Ordered in UC Medications  methylPREDNISolone sodium succinate (SOLU-MEDROL) 125 mg/2 mL injection 80 mg (not administered)  acetaminophen (TYLENOL) tablet 650 mg (650 mg Oral Given 10/01/16 1420)  ipratropium-albuterol (DUONEB) 0.5-2.5 (3) MG/3ML nebulizer solution 3 mL (3 mLs Nebulization Given 10/01/16 1526)     Initial Impression / Assessment and Plan / UC Course  I have reviewed the triage vital signs and the nursing notes.  Pertinent labs & imaging results that were available during my care of the patient were reviewed by me and considered in my medical decision making (see chart for details).  Clinical Course   Patient may be developing a viral URI.  Normal WBC reassuring. Administered Solumedrol 80mg  IM Begin Symbicort 80/4.5, one puff BID (patient unable to use  Advair discus). Start prednisone burst/taper tomorrow. Expect that patient's hypoxia will improve after steroid  burst, and initiation of Symbicort. Take plain guaifenesin (600mg  extended release tabs such as Mucinex) twice daily, with plenty of water, for cough and congestion.  Get adequate rest.   May use Afrin nasal spray (or generic oxymetazoline) twice daily for about 5 days and then discontinue.  Also recommend using saline nasal spray several times daily and saline nasal irrigation (AYR is a common brand).  Use Flonase nasal spray each morning after using Afrin nasal spray and saline nasal irrigation. Continue albuterol inhaler as prescribed. Try warm salt water gargles for sore throat.  Stop all antihistamines for now, and other non-prescription cough/cold preparations. Begin Omnicef if not improving about 5 days or if persistent fever develops (Given a prescription to hold, with an expiration date)  Followup with Family Doctor in 4 days. Follow-up with family doctor if not improving about one week.     Final Clinical Impressions(s) / UC Diagnoses   Final diagnoses:  Bilateral impacted cerumen  Viral URI with cough  Mild persistent asthma with exacerbation    New Prescriptions New Prescriptions   BUDESONIDE-FORMOTEROL (SYMBICORT) 80-4.5 MCG/ACT INHALER    Inhale 1 puff BID   CEFDINIR (OMNICEF) 300 MG CAPSULE    Take 1 capsule (300 mg total) by mouth 2 (two) times daily. (Rx void after 10/10/16)   PREDNISONE (DELTASONE) 20 MG TABLET    Take one tab by mouth twice daily for 3 days, then one daily for 3 days. Take with food.     Lattie Haw, MD 10/08/16 [phone]

## 2016-10-02 ENCOUNTER — Telehealth: Payer: Self-pay | Admitting: *Deleted

## 2016-10-02 NOTE — Telephone Encounter (Signed)
Callback: No answer, LMOM  F/u from visit.  Call back as needed.

## 2016-10-03 ENCOUNTER — Other Ambulatory Visit: Payer: Self-pay | Admitting: Physician Assistant

## 2016-10-06 ENCOUNTER — Other Ambulatory Visit: Payer: Self-pay | Admitting: *Deleted

## 2016-10-06 MED ORDER — PROPRANOLOL HCL ER 60 MG PO CP24: 60.0000 mg | ORAL_CAPSULE | Freq: Every day | ORAL | 1 refills | Status: AC

## 2016-10-06 MED ORDER — OXYBUTYNIN CHLORIDE 5 MG PO TABS: 5.0000 mg | ORAL_TABLET | Freq: Every day | ORAL | 1 refills | Status: AC

## 2016-10-06 MED ORDER — HYDROCHLOROTHIAZIDE 12.5 MG PO TABS
12.5000 mg | ORAL_TABLET | Freq: Every day | ORAL | 1 refills | Status: AC
Start: 2016-10-06 — End: ?

## 2016-10-06 MED ORDER — SIMVASTATIN 20 MG PO TABS: 20.0000 mg | ORAL_TABLET | Freq: Every day | ORAL | 1 refills | Status: AC

## 2016-10-06 MED ORDER — RABEPRAZOLE SODIUM 20 MG PO TBEC: 20.0000 mg | DELAYED_RELEASE_TABLET | Freq: Two times a day (BID) | ORAL | 1 refills | Status: AC

## 2016-10-09 ENCOUNTER — Telehealth: Payer: Self-pay | Admitting: *Deleted

## 2016-10-09 NOTE — Telephone Encounter (Signed)
[facility] called wanting to clarify if the last oxybutin prescription that was sent to them is supposed to be once a day or two to three times a day. The prescription sent is not the XR

## 2016-10-09 NOTE — Telephone Encounter (Signed)
Should be XR 5mg  once a day. #90 1 refill.

## 2016-10-12 ENCOUNTER — Encounter: Payer: Self-pay | Admitting: Physician Assistant

## 2016-10-12 ENCOUNTER — Ambulatory Visit (INDEPENDENT_AMBULATORY_CARE_PROVIDER_SITE_OTHER): Payer: Medicare Other | Admitting: Physician Assistant

## 2016-10-12 VITALS — BP 148/64 | HR 72 | Ht <= 58 in | Wt 111.0 lb

## 2016-10-12 DIAGNOSIS — J069 Acute upper respiratory infection, unspecified: Secondary | ICD-10-CM | POA: Diagnosis not present

## 2016-10-12 DIAGNOSIS — J4521 Mild intermittent asthma with (acute) exacerbation: Secondary | ICD-10-CM | POA: Diagnosis not present

## 2016-10-12 DIAGNOSIS — K219 Gastro-esophageal reflux disease without esophagitis: Secondary | ICD-10-CM

## 2016-10-12 DIAGNOSIS — G2581 Restless legs syndrome: Secondary | ICD-10-CM

## 2016-10-12 DIAGNOSIS — J209 Acute bronchitis, unspecified: Secondary | ICD-10-CM

## 2016-10-12 DIAGNOSIS — J0120 Acute ethmoidal sinusitis, unspecified: Secondary | ICD-10-CM

## 2016-10-12 MED ORDER — BUDESONIDE-FORMOTEROL FUMARATE 80-4.5 MCG/ACT IN AERO: INHALATION_SPRAY | RESPIRATORY_TRACT | 1 refills | Status: DC

## 2016-10-12 MED ORDER — GUAIFENESIN ER 600 MG PO TB12: 600.0000 mg | ORAL_TABLET | Freq: Two times a day (BID) | ORAL | 5 refills | Status: DC | PRN

## 2016-10-12 MED ORDER — OXYBUTYNIN CHLORIDE ER 5 MG PO TB24: 5.0000 mg | ORAL_TABLET | Freq: Every day | ORAL | 1 refills | Status: AC

## 2016-10-12 MED ORDER — GUAIFENESIN ER 600 MG PO TB12: 600.0000 mg | ORAL_TABLET | Freq: Two times a day (BID) | ORAL | 1 refills | Status: AC | PRN

## 2016-10-12 MED ORDER — AZITHROMYCIN 250 MG PO TABS: ORAL_TABLET | ORAL | 0 refills | Status: DC

## 2016-10-12 MED ORDER — RANITIDINE HCL 300 MG PO TABS: 300.0000 mg | ORAL_TABLET | Freq: Every day | ORAL | 1 refills | Status: AC

## 2016-10-12 NOTE — Patient Instructions (Signed)
Take iron supplements every other day.  Increase mirapex at bedtime to 2 tablets to see if helps restless leg Start azithromycin for upper respiratory  Make sure using spacer with symbicort Try zantac before dinner

## 2016-10-12 NOTE — Progress Notes (Addendum)
Subjective:    Patient ID: Jamie StanfordJane Garcia, female    DOB: 03-25-1920, 82 y.o.   MRN: [id number]  HPI Patient is a 82 yo female coming to the office with increased sinus congestion and cough for over three weeks.Patient's son is with the patient and contributes to the history. Patient went to urgent care November 30 for these symptoms. Patient was prescribed prednisone which gave the patient relief for a week. She was also prescribed Omnicef but did not fill the prescription because she reports that she was told to not fill the prescription unless she developed a fever. Patient feeling worse the past three days. Patient has been feeling short of breath for two weeks. Patient has been coughing for two weeks and reports coughing up some phlegm. She reports that the phlegm is yellow in color. Patient reports she has exposure to grandchildren who has been sick lately. She reports using Proair three times a day for three weeks. Patient denies chest pain, edema and fever. She had not been using her symbicort inhaler because it is hard for her to suck in the medicine.   Patient reports that she has been experiencing restless leg that has been bothering her. She reports that her leg jerks and she experiences a achy pain that she rates a 10/10. Patient is on primidone 50 mg and premipexole 0.25 mg at bedtime for restless leg. Patient reports that she has been jerky and shaky for years. She was being managed by a neurologist when she lived in [place]; however, she moved to [place] this April and does not have a neurologist established.    Patient also reports pain in her abdomen after she eats, mainly after dinner when she lays down for bed at night. Patient denies diarrhea or constipation. She says that she typically has a bowel movement everyday.   Patient also [facility]    Review of Systems  All other systems reviewed and are negative.      Objective: Blood pressure (!)  148/64, pulse 72, height 4\' 8"  (1.422 m), weight 111 lb (50.3 kg), SpO2 96 %.   Physical Exam  Constitutional: She appears well-developed and well-nourished.  Patient is thin. She is in a wheelchair and accompanied by her son.   HENT:  Head: Normocephalic and atraumatic.  Right Ear: External ear normal.  Left Ear: External ear normal.  No tenderness to palpation of maxillary and frontal sinuses.  TM's erythematous with dull membrane.  Oropharynx erythematous with PND.  Bilateral nasal turbinates red and swollen.   Eyes:  Watery discharge from bilateral eyes.   Neck: Normal range of motion. Neck supple.  Cardiovascular: Normal rate.   Murmur (Systolic murmur 3/6 auscultated best heart at the left sternal border. ) heard. Pulmonary/Chest: Effort normal and breath sounds normal. No respiratory distress. She has no wheezes. She has no rales.  Abdominal: Soft. She exhibits no distension. There is tenderness (Tenderness in epigastric region.).  Lymphadenopathy:    She has cervical adenopathy.  Neurological: She is alert.      Assessment & Plan:  Diagnoses and all orders for this visit:  1. Acute upper respiratory infection/acute bronchitis/acute sinusitis - Start azithromycin (ZITHROMAX) 250 MG tablet; Take 2 tablets now and then one tablet for 4 days due to symptoms of cough and sinus congestion lasting for over three weeks. - Continue guaiFENesin (MUCINEX) 600 MG 12 hr tablet; Take 1 tablet (600 mg total) by mouth 2 (two) times  daily as needed for sinus congestion. Patient advised to drink plenty of water with this medication.  -Follow-up if symptoms worsen or do not improve within five days.   2. RLS (restless legs syndrome)       -Advised patient to take increase pramipexole to two 0.25 mg tablets at night for restless leg.   3. Gastroesophageal reflux disease without esophagitis -Start ranitidine (ZANTAC) 300 MG tablet; Take 1 tablet (300 mg total) by mouth before dinner to help  with acid reflux symptoms since patient is experiencing symptoms when she lays down in bed at night.   4. Mild intermittent asthma with acute exacerbation -Continue budesonide-formoterol (SYMBICORT) 80-4.5 MCG/ACT inhaler; Inhale 1 puff BID. Advised patient to use spacer to help with inhalation of medication.  -Use Proair as needed to help with shortness of breath  -discussed using a spacer that she has with ease of using medication. -wash out mouth after every use.

## 2016-10-16 ENCOUNTER — Encounter: Payer: Self-pay | Admitting: Sports Medicine

## 2016-10-16 ENCOUNTER — Ambulatory Visit (INDEPENDENT_AMBULATORY_CARE_PROVIDER_SITE_OTHER): Payer: Medicare Other

## 2016-10-16 ENCOUNTER — Ambulatory Visit (INDEPENDENT_AMBULATORY_CARE_PROVIDER_SITE_OTHER): Payer: Medicare Other | Admitting: Sports Medicine

## 2016-10-16 DIAGNOSIS — J189 Pneumonia, unspecified organism: Secondary | ICD-10-CM

## 2016-10-16 DIAGNOSIS — J9811 Atelectasis: Secondary | ICD-10-CM | POA: Diagnosis not present

## 2016-10-16 DIAGNOSIS — I517 Cardiomegaly: Secondary | ICD-10-CM | POA: Diagnosis not present

## 2016-10-16 DIAGNOSIS — J4520 Mild intermittent asthma, uncomplicated: Secondary | ICD-10-CM | POA: Diagnosis not present

## 2016-10-16 MED ORDER — PREDNISONE 50 MG PO TABS: ORAL_TABLET | ORAL | 0 refills | Status: DC

## 2016-10-16 MED ORDER — DOXYCYCLINE HYCLATE 100 MG PO TABS: 100.0000 mg | ORAL_TABLET | Freq: Two times a day (BID) | ORAL | 0 refills | Status: AC

## 2016-10-16 NOTE — Progress Notes (Signed)
  Subjective:    CC: Shortness of breath  HPI: This is a pleasant 82 year old female, she comes in with a month long history of shortness of breath, acutely worsened. Mild cough, mild malaise. Symptoms are mild, persistent. Cough is minimally productive, no constitutional symptoms.  Past medical history:  Negative.  See flowsheet/record as well for more information.  Surgical history: Negative.  See flowsheet/record as well for more information.  Family history: Negative.  See flowsheet/record as well for more information.  Social history: Negative.  See flowsheet/record as well for more information.  Allergies, and medications have been entered into the medical record, reviewed, and no changes needed.   Review of Systems: No fevers, chills, night sweats, weight loss, chest pain, or shortness of breath.   Objective:    General: Well Developed, well nourished, and in no acute distress.  Neuro: Alert and oriented x3, extra-ocular muscles intact, sensation grossly intact.  HEENT: Normocephalic, atraumatic, pupils equal round reactive to light, neck supple, no masses, no lymphadenopathy, thyroid nonpalpable.  Skin: Warm and dry, no rashes. Cardiac: Regular rate and rhythm, no murmurs rubs or gallops, no lower extremity edema.  Respiratory: Clear to auscultation bilaterally. Not using accessory muscles, speaking in full sentences.  Chest x-ray shows some patchy mid left atelectasis versus pneumonia.  Impression and Recommendations:    Asthma, mild intermittent Vague shortness of breath with stable vitals. BNP. She is using her inhalers as directed. I will give her a several day course of prednisone. Checking some blood work included a d-dimer. Chest x-ray. Return for pre-and post-bronchitis spirometry.  Chest x-ray does show a patchy infiltrate consistent with pneumonia. Adding doxycycline, discussed with patient's son on the phone.

## 2016-10-16 NOTE — Assessment & Plan Note (Addendum)
Vague shortness of breath with stable vitals. BNP. She is using her inhalers as directed. I will give her a several day course of prednisone. Checking some blood work included a d-dimer. Chest x-ray. Return for pre-and post-bronchitis spirometry.  Chest x-ray does show a patchy infiltrate consistent with pneumonia. Adding doxycycline, discussed with patient's son on the phone.  D-dimer came back elevated, adding a CT angiogram of the pulmonary arteries

## 2016-10-17 LAB — BRAIN NATRIURETIC PEPTIDE: Brain Natriuretic Peptide: 66.6 pg/mL (ref <100)

## 2016-10-17 LAB — COMPREHENSIVE METABOLIC PANEL WITH GFR
AST: 26 U/L (ref 10–35)
Albumin: 3.1 g/dL — ABNORMAL LOW (ref 3.6–5.1)
CO2: 29 mmol/L (ref 20–31)
Chloride: 98 mmol/L (ref 98–110)
Creat: 1 mg/dL — ABNORMAL HIGH (ref 0.60–0.88)
Glucose, Bld: 84 mg/dL (ref 65–99)
Sodium: 136 mmol/L (ref 135–146)
Total Bilirubin: 0.6 mg/dL (ref 0.2–1.2)

## 2016-10-17 LAB — CBC
HCT: 45.9 % — ABNORMAL HIGH (ref 35.0–45.0)
Hemoglobin: 15.1 g/dL (ref 11.7–15.5)
MCH: 31.5 pg (ref 27.0–33.0)
MCHC: 32.9 g/dL (ref 32.0–36.0)
MCV: 95.6 fL (ref 80.0–100.0)
MPV: 11.4 fL (ref 7.5–12.5)
Platelets: 299 K/uL (ref 140–400)
RBC: 4.8 MIL/uL (ref 3.80–5.10)
RDW: 13.6 % (ref 11.0–15.0)
WBC: 10 K/uL (ref 3.8–10.8)

## 2016-10-17 LAB — COMPREHENSIVE METABOLIC PANEL
ALT: 15 U/L (ref 6–29)
Alkaline Phosphatase: 108 U/L (ref 33–130)
BUN: 34 mg/dL — ABNORMAL HIGH (ref 7–25)
Calcium: 8.7 mg/dL (ref 8.6–10.4)
Potassium: 5.1 mmol/L (ref 3.5–5.3)
Total Protein: 6.1 g/dL (ref 6.1–8.1)

## 2016-10-17 LAB — D-DIMER, QUANTITATIVE: D-Dimer, Quant: 0.77 mcg/mL FEU — ABNORMAL HIGH (ref <0.50)

## 2016-10-19 ENCOUNTER — Telehealth: Payer: Self-pay | Admitting: Sports Medicine

## 2016-10-19 ENCOUNTER — Encounter (HOSPITAL_BASED_OUTPATIENT_CLINIC_OR_DEPARTMENT_OTHER): Payer: Self-pay

## 2016-10-19 ENCOUNTER — Ambulatory Visit (HOSPITAL_BASED_OUTPATIENT_CLINIC_OR_DEPARTMENT_OTHER)
Admission: RE | Admit: 2016-10-19 | Discharge: 2016-10-19 | Disposition: A | Discharge status: Home / Self Care | Payer: Medicare Other | Source: Ambulatory Visit | Attending: Sports Medicine | Admitting: Sports Medicine

## 2016-10-19 DIAGNOSIS — M4854XA Collapsed vertebra, not elsewhere classified, thoracic region, initial encounter for fracture: Secondary | ICD-10-CM | POA: Diagnosis not present

## 2016-10-19 DIAGNOSIS — R590 Localized enlarged lymph nodes: Secondary | ICD-10-CM | POA: Diagnosis not present

## 2016-10-19 DIAGNOSIS — I2510 Atherosclerotic heart disease of native coronary artery without angina pectoris: Secondary | ICD-10-CM | POA: Diagnosis not present

## 2016-10-19 DIAGNOSIS — J4520 Mild intermittent asthma, uncomplicated: Secondary | ICD-10-CM | POA: Diagnosis not present

## 2016-10-19 DIAGNOSIS — D71 Functional disorders of polymorphonuclear neutrophils: Secondary | ICD-10-CM | POA: Insufficient documentation

## 2016-10-19 DIAGNOSIS — I517 Cardiomegaly: Secondary | ICD-10-CM | POA: Insufficient documentation

## 2016-10-19 MED ORDER — ONDANSETRON 8 MG PO TBDP: 8.0000 mg | ORAL_TABLET | Freq: Three times a day (TID) | ORAL | 3 refills | Status: AC | PRN

## 2016-10-19 MED ORDER — IOPAMIDOL (ISOVUE-300) INJECTION 61%: 80.0000 mL | Freq: Once | INTRAVENOUS | Status: DC | PRN

## 2016-10-19 MED ORDER — IOPAMIDOL (ISOVUE-370) INJECTION 76%
100.0000 mL | Freq: Once | INTRAVENOUS | Status: AC | PRN
  Administered 2016-10-19: 100 mL via INTRAVENOUS

## 2016-10-19 NOTE — Telephone Encounter (Signed)
Continue the medication for now, I am going to call in a bit of Zofran for the nausea, also going take the antibiotic with food.

## 2016-10-19 NOTE — Addendum Note (Signed)
Addended by: Monica BectonHEKKEKANDAM, Rosanna Bickle J on: 10/19/2016 08:29 AM   Modules accepted: Orders

## 2016-10-19 NOTE — Telephone Encounter (Signed)
Orvilla Fusommy (patients's son) called into the office this morning to let Dr. Benjamin Stainhekkekandam know that Jamie Garcia is having frequent nausea and diarrhea when taking the  Doxycycline.  He would like to know if she needs to continue on the medication.  Call back number is [phone].

## 2016-10-21 ENCOUNTER — Other Ambulatory Visit: Payer: Self-pay | Admitting: Physician Assistant

## 2016-11-06 ENCOUNTER — Ambulatory Visit: Payer: Medicare Other | Admitting: Physician Assistant

## 2016-11-09 ENCOUNTER — Ambulatory Visit (INDEPENDENT_AMBULATORY_CARE_PROVIDER_SITE_OTHER): Payer: Medicare Other | Admitting: Sports Medicine

## 2016-11-09 VITALS — BP 147/84 | HR 71 | Ht <= 58 in | Wt 110.0 lb

## 2016-11-09 DIAGNOSIS — J4521 Mild intermittent asthma with (acute) exacerbation: Secondary | ICD-10-CM

## 2016-11-09 DIAGNOSIS — R0602 Shortness of breath: Secondary | ICD-10-CM

## 2016-11-09 DIAGNOSIS — J449 Chronic obstructive pulmonary disease, unspecified: Secondary | ICD-10-CM | POA: Diagnosis not present

## 2016-11-09 MED ORDER — BUDESONIDE-FORMOTEROL FUMARATE 160-4.5 MCG/ACT IN AERO: 1.0000 | INHALATION_SPRAY | Freq: Two times a day (BID) | RESPIRATORY_TRACT | 4 refills | Status: AC

## 2016-11-09 MED ORDER — BUDESONIDE-FORMOTEROL FUMARATE 160-4.5 MCG/ACT IN AERO: 2.0000 | INHALATION_SPRAY | Freq: Two times a day (BID) | RESPIRATORY_TRACT | 4 refills | Status: DC

## 2016-11-09 MED ORDER — ALBUTEROL SULFATE (2.5 MG/3ML) 0.083% IN NEBU
2.5000 mg | INHALATION_SOLUTION | Freq: Once | RESPIRATORY_TRACT | Status: AC
  Administered 2016-11-09: 2.5 mg via RESPIRATORY_TRACT

## 2016-11-09 NOTE — Assessment & Plan Note (Signed)
Pre-and postbronchodilator spirometry in January 2018 shows FEV1/FVC of 64%, not much improved post bronchodilator.  Symbicort 160. Return as needed.

## 2016-11-09 NOTE — Progress Notes (Signed)
  Subjective:    CC: Spirometry  HPI: I treated this pleasant 83 year old female for what appeared to be a COPD exacerbation, she returns today with symptoms completely resolved. She is here for pre-and postbronchodilator spirometry  Past medical history:  Negative.  See flowsheet/record as well for more information.  Surgical history: Negative.  See flowsheet/record as well for more information.  Family history: Negative.  See flowsheet/record as well for more information.  Social history: Negative.  See flowsheet/record as well for more information.  Allergies, and medications have been entered into the medical record, reviewed, and no changes needed.   Review of Systems: No fevers, chills, night sweats, weight loss, chest pain, or shortness of breath.   Objective:    General: Well Developed, well nourished, and in no acute distress.  Neuro: Alert and oriented x3, extra-ocular muscles intact, sensation grossly intact.  HEENT: Normocephalic, atraumatic, pupils equal round reactive to light, neck supple, no masses, no lymphadenopathy, thyroid nonpalpable.  Skin: Warm and dry, no rashes. Cardiac: Regular rate and rhythm, no murmurs rubs or gallops, no lower extremity edema.  Respiratory: Clear to auscultation bilaterally. Not using accessory muscles, speaking in full sentences.  Spirometry shows an FEV1 over FVC of less than 70%, only slight improvement post bronchodilator.  Impression and Recommendations:    COPD, mild (HCC) Pre-and postbronchodilator spirometry in January 2018 shows FEV1/FVC of 64%, not much improved post bronchodilator.  Symbicort 160. Return as needed.  I spent 25 minutes with this patient, greater than 50% was face-to-face time counseling regarding the above diagnoses, this was separate from the time spent performing the spirometry.

## 2016-11-09 NOTE — Addendum Note (Signed)
Addended by: Monica BectonHEKKEKANDAM, THOMAS J on: 11/09/2016 04:42 PM   Modules accepted: Orders

## 2016-11-09 NOTE — Patient Instructions (Signed)
Use the old inhaler at 2 puffs twice a day until you run out.

## 2016-12-01 ENCOUNTER — Other Ambulatory Visit: Payer: Self-pay | Admitting: Physician Assistant

## 2017-12-05 IMAGING — DX DG CERVICAL SPINE 2 OR 3 VIEWS
5 series · 5 of 5 positions shown · non-contrast
Comparison: None

CLINICAL DATA: Sharp neck pain and upper back pain since yesterday,
increasing neck pain today

EXAM:
CERVICAL SPINE - 2-3 VIEW

[c-spine lat]
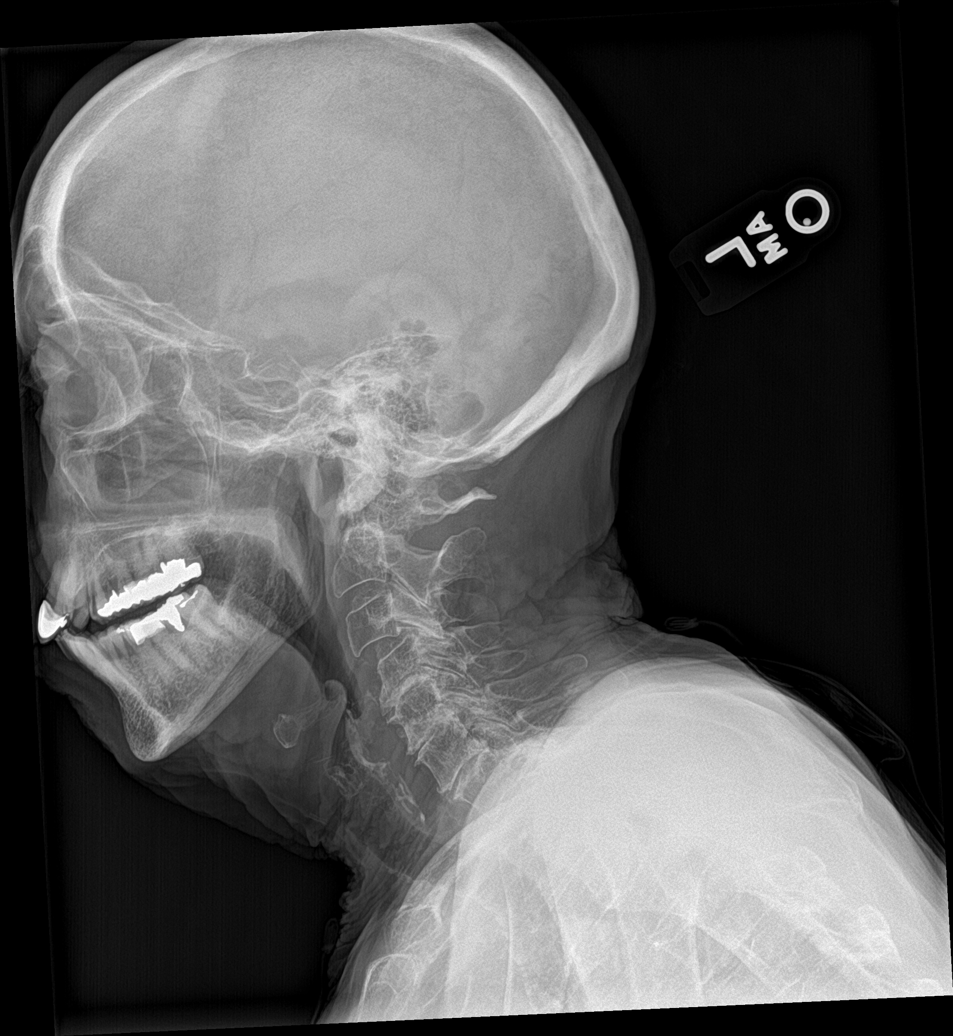

[c-spine ap]
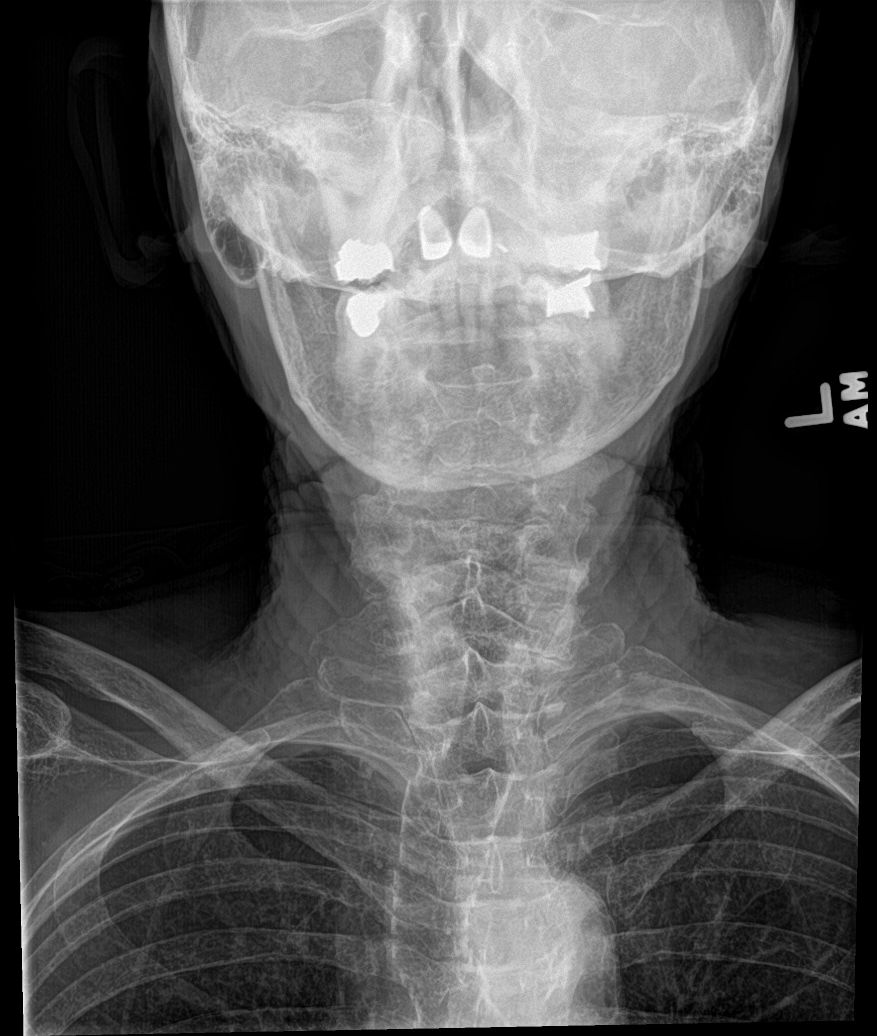

[c-spine open mouth]
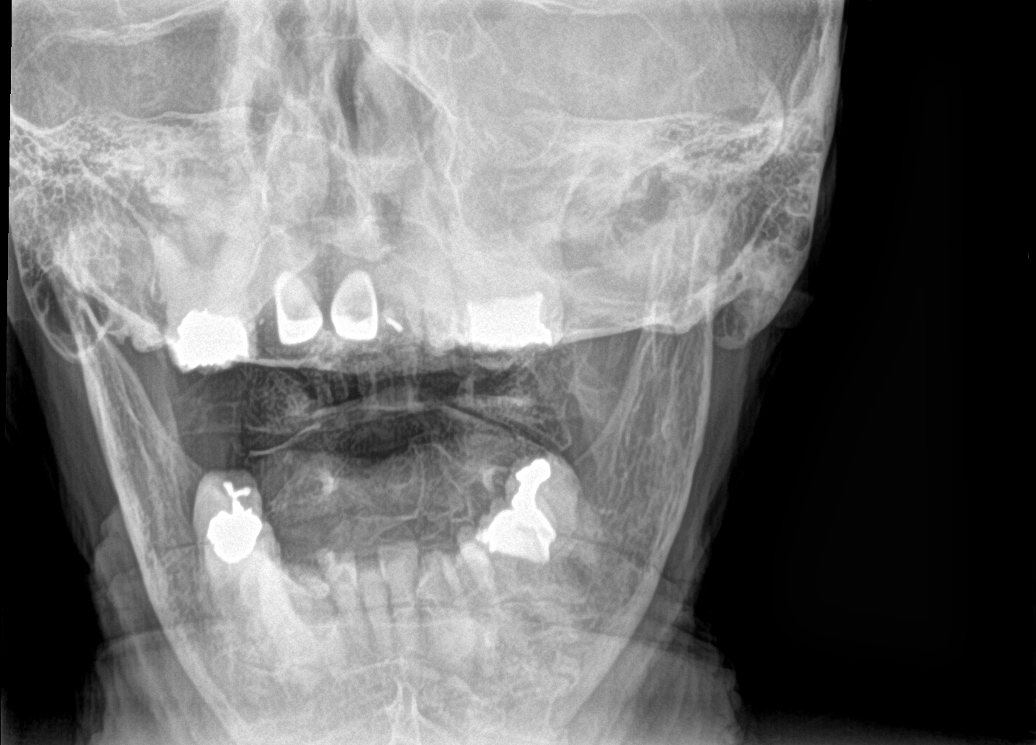

[c-spine swimmers]
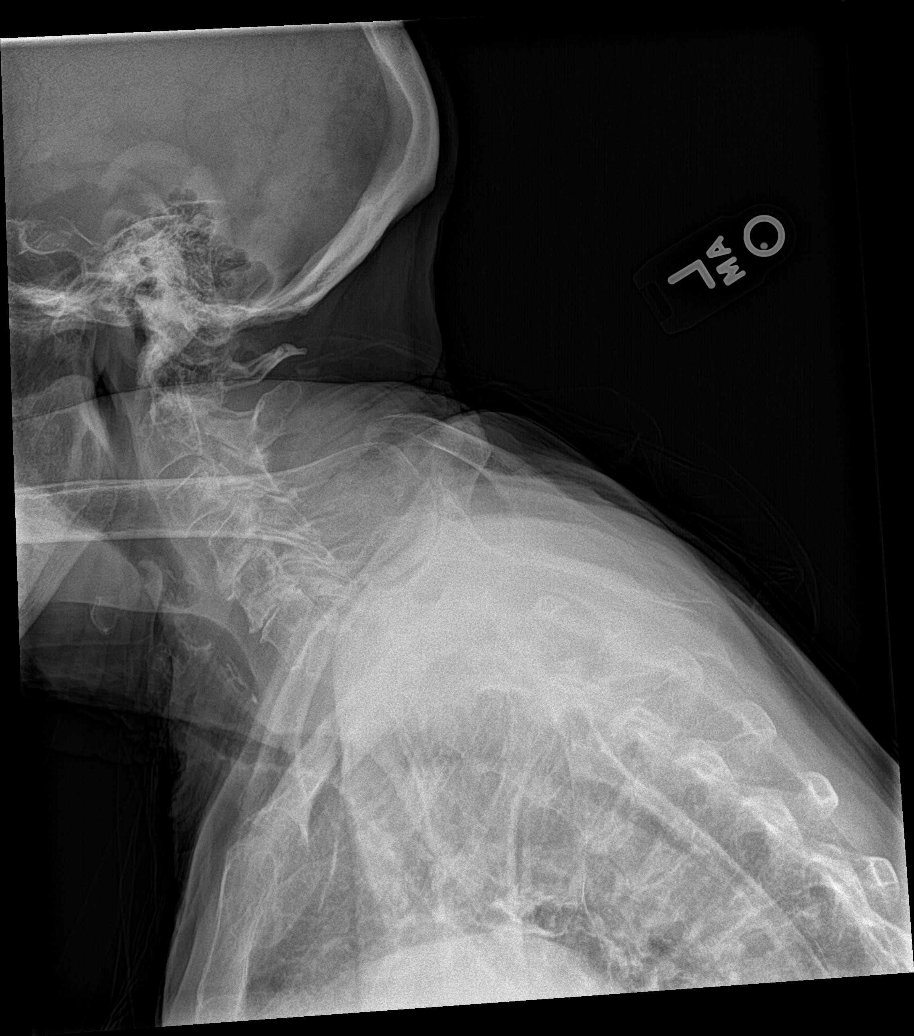

[[person_name]]
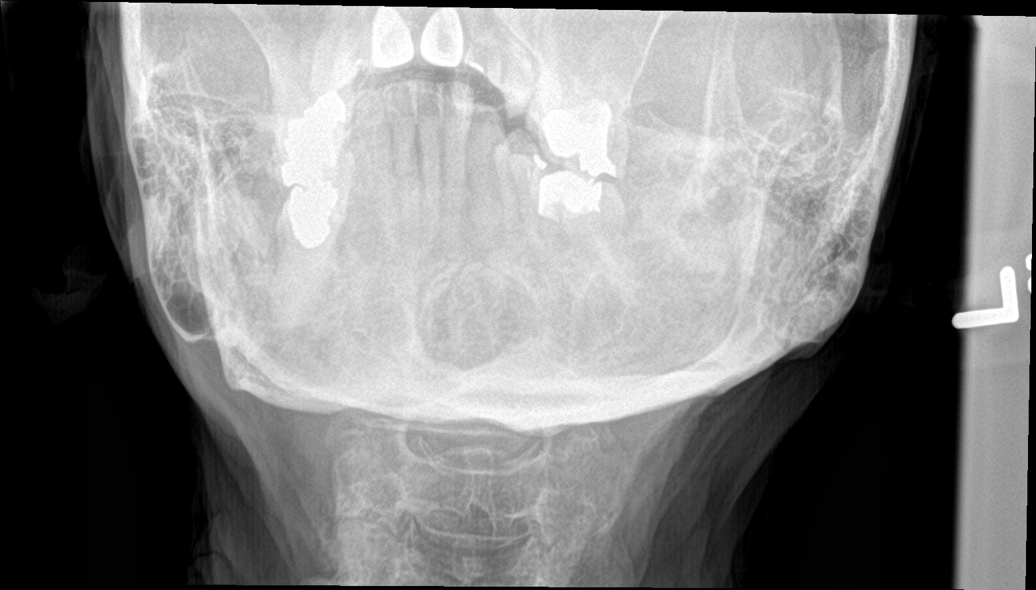

[5 of 5 positions shown; findings below may reference images not displayed]

FINDINGS: Osseous demineralization.

Vertebral body heights maintained.

Disc space narrowing with endplate spur formation at C4-C5 through
C6-C7.

Anterolisthesis C3-C4 approximately 2.8 mm.

Multilevel facet degenerative changes.

No acute fracture, additional subluxation, or bone destruction.
IMPRESSION: Degenerative disc and facet disease changes of the cervical spine as
above.

Mild anterolisthesis C3-C4 likely due to degenerative facet disease.

## 2017-12-16 ENCOUNTER — Telehealth: Payer: Self-pay | Admitting: Physician Assistant

## 2017-12-16 IMAGING — DX DG KNEE COMPLETE 4+V*L*
4 series · 4 of 4 positions shown · non-contrast
Comparison: None.

CLINICAL DATA: Fall 4 days ago with left knee pain, initial
encounter

EXAM:
LEFT KNEE - COMPLETE 4+ VIEW

[knee ap]
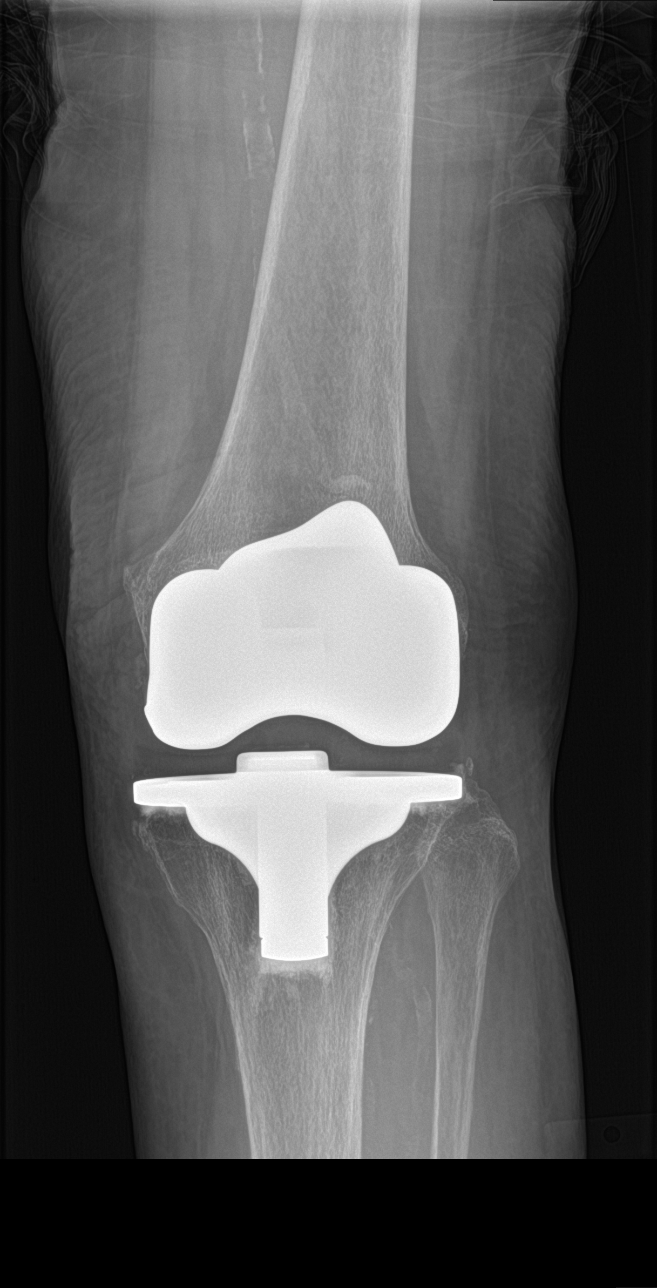

[knee lat]
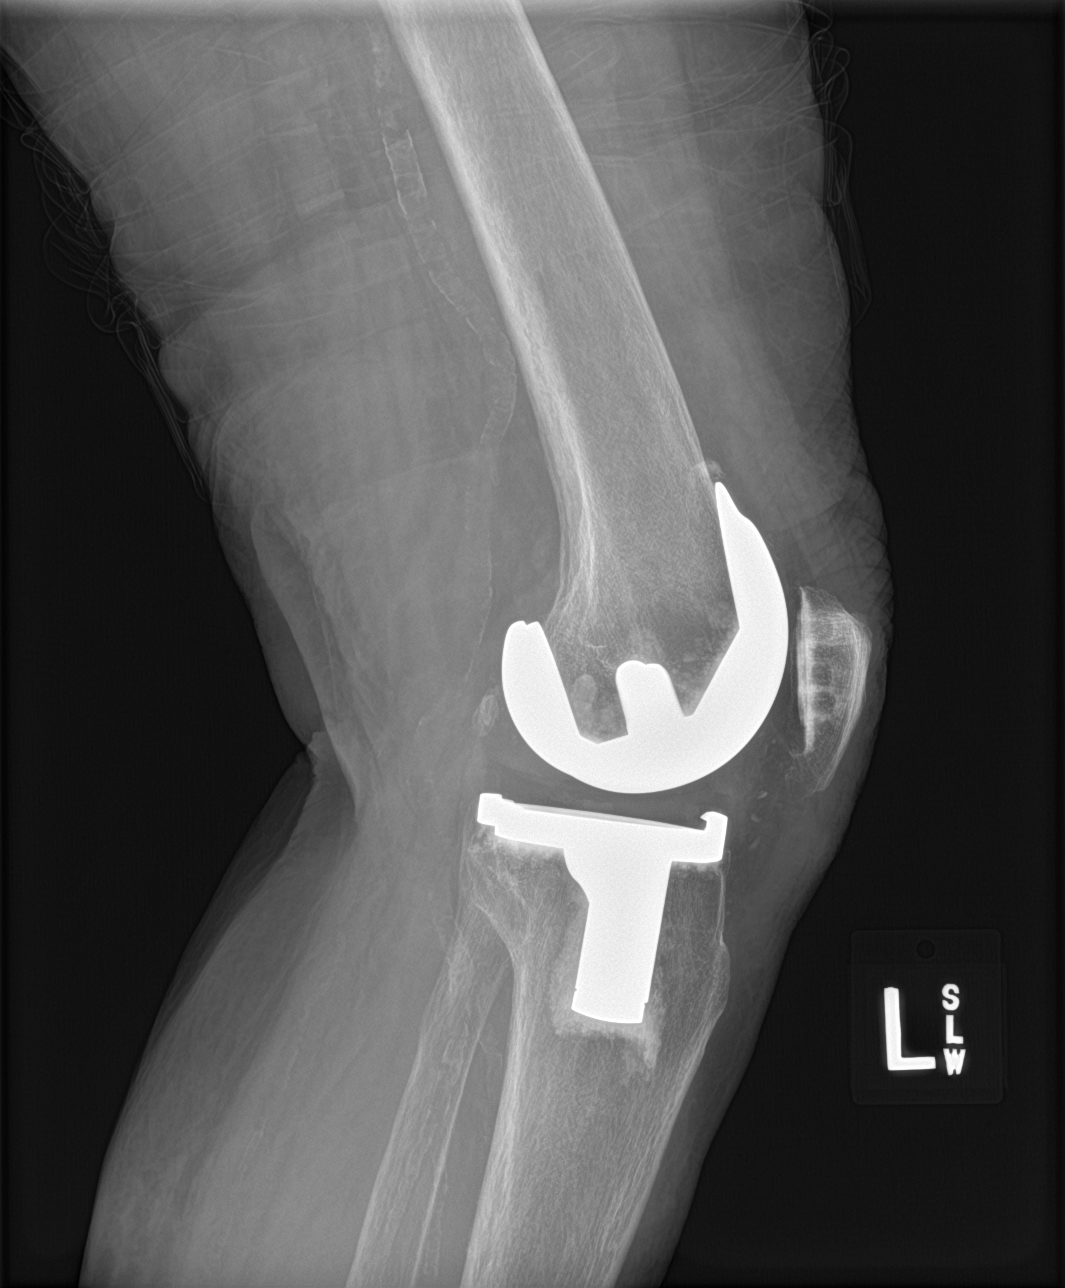

[knee obl (1 of 2)]
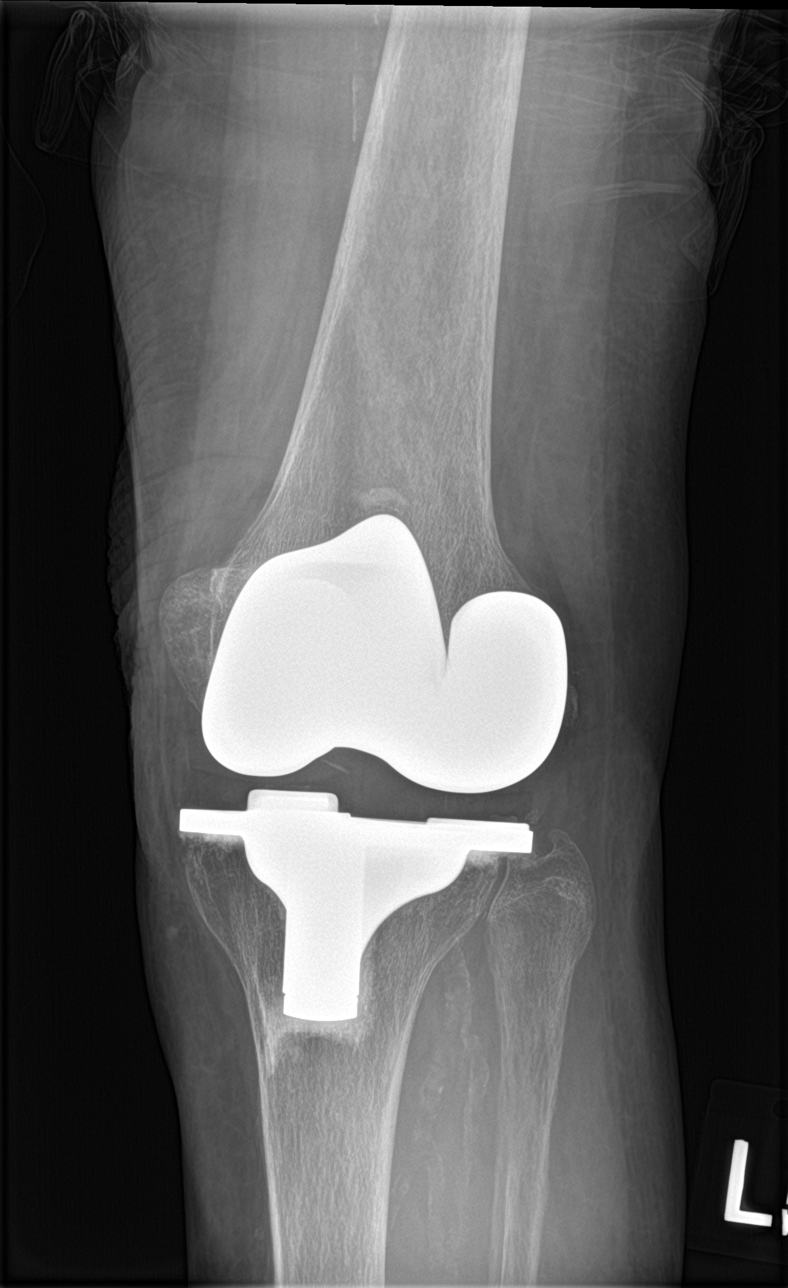

[knee obl (2 of 2)]
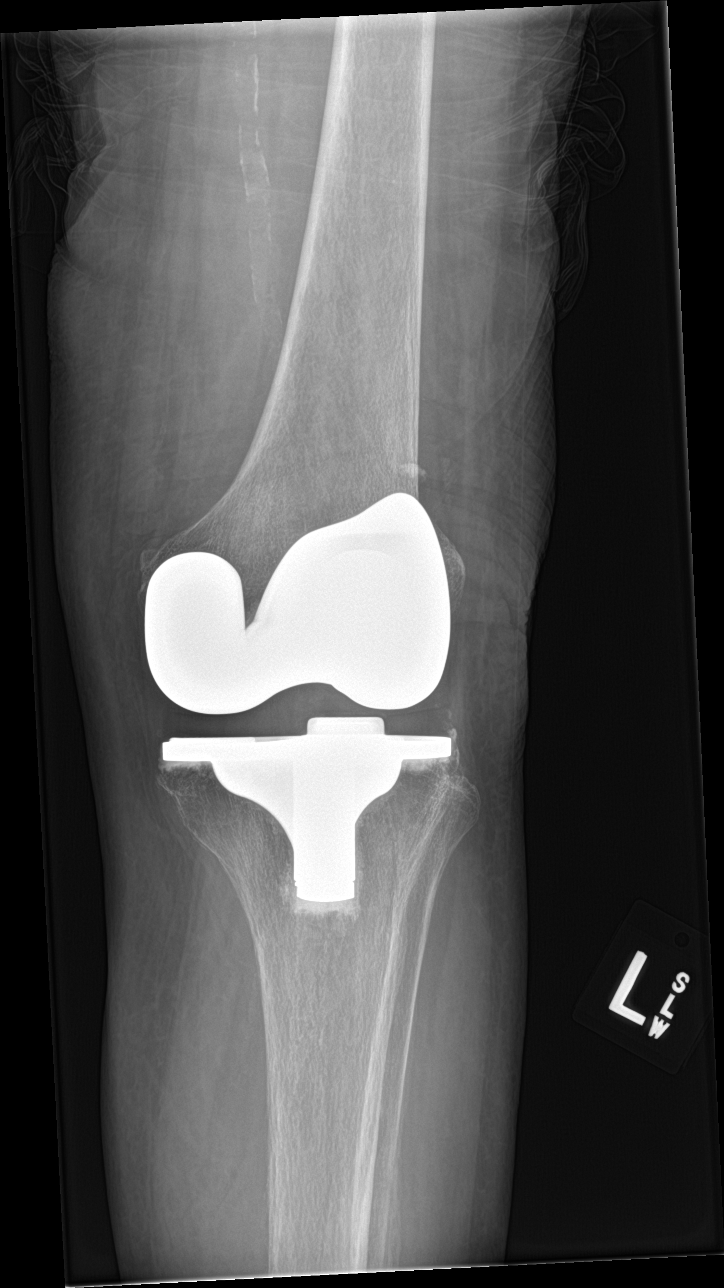

[4 of 4 positions shown; findings below may reference images not displayed]

FINDINGS: Left knee prosthesis is seen in satisfactory position. No acute
fracture or dislocation is noted. No joint effusion is seen. Diffuse
vascular calcifications are noted.
IMPRESSION: No acute abnormality noted.

## 2017-12-16 NOTE — Telephone Encounter (Signed)
Please take my name off banner. Thanks!

## 2017-12-16 NOTE — Telephone Encounter (Signed)
FYI, Pt's daughter in law Gigi Gin(Peggy) called to advise Jamie Garcia has moved to [place] permanently. Lesly RubensteinJade will no longer be the Pt's PCP.

## 2017-12-17 NOTE — Telephone Encounter (Signed)
Removed

## 2018-01-31 DEATH — deceased
